# Patient Record
Sex: Female | Born: 1955 | Hispanic: No | Marital: Single | Smoking: Former smoker
Health system: Southern US, Community
[De-identification: ages and names within clinical notes are randomized; demographics above are authoritative.]

## PROBLEM LIST (undated history)

## (undated) DIAGNOSIS — G4700 Insomnia, unspecified: Secondary | ICD-10-CM

## (undated) DIAGNOSIS — F32A Depression, unspecified: Secondary | ICD-10-CM

## (undated) DIAGNOSIS — R0789 Other chest pain: Secondary | ICD-10-CM

## (undated) DIAGNOSIS — F329 Major depressive disorder, single episode, unspecified: Secondary | ICD-10-CM

## (undated) DIAGNOSIS — E785 Hyperlipidemia, unspecified: Secondary | ICD-10-CM

## (undated) HISTORY — DX: Other chest pain: R07.89

## (undated) HISTORY — DX: Depression, unspecified: F32.A

## (undated) HISTORY — DX: Hyperlipidemia, unspecified: E78.5

## (undated) HISTORY — DX: Insomnia, unspecified: G47.00

## (undated) HISTORY — DX: Major depressive disorder, single episode, unspecified: F32.9

## (undated) HISTORY — PX: HERNIA REPAIR: SHX51

---
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
## 2001-01-17 ENCOUNTER — Other Ambulatory Visit: Admission: RE | Admit: 2001-01-17 | Discharge: 2001-01-17 | Payer: Self-pay | Admitting: Obstetrics & Gynecology

## 2002-02-02 ENCOUNTER — Other Ambulatory Visit: Admission: RE | Admit: 2002-02-02 | Discharge: 2002-02-02 | Payer: Self-pay | Admitting: Obstetrics & Gynecology

## 2003-03-21 ENCOUNTER — Other Ambulatory Visit: Admission: RE | Admit: 2003-03-21 | Discharge: 2003-03-21 | Payer: Self-pay | Admitting: Obstetrics and Gynecology

## 2004-04-03 ENCOUNTER — Other Ambulatory Visit: Admission: RE | Admit: 2004-04-03 | Discharge: 2004-04-03 | Payer: Self-pay | Admitting: Obstetrics and Gynecology

## 2005-06-08 ENCOUNTER — Other Ambulatory Visit: Admission: RE | Admit: 2005-06-08 | Discharge: 2005-06-08 | Payer: Self-pay | Admitting: Obstetrics and Gynecology

## 2007-01-06 ENCOUNTER — Encounter: Admission: RE | Admit: 2007-01-06 | Discharge: 2007-01-06 | Payer: Self-pay | Admitting: Family Medicine

## 2008-12-30 ENCOUNTER — Encounter: Admission: RE | Admit: 2008-12-30 | Discharge: 2008-12-30 | Payer: Self-pay | Admitting: Family Medicine

## 2011-01-29 ENCOUNTER — Other Ambulatory Visit (HOSPITAL_COMMUNITY): Payer: Self-pay | Admitting: Obstetrics and Gynecology

## 2011-01-29 DIAGNOSIS — R109 Unspecified abdominal pain: Secondary | ICD-10-CM

## 2011-02-03 ENCOUNTER — Ambulatory Visit (HOSPITAL_COMMUNITY): Admission: RE | Admit: 2011-02-03 | Payer: 59 | Source: Ambulatory Visit

## 2011-09-10 ENCOUNTER — Ambulatory Visit
Admission: RE | Admit: 2011-09-10 | Discharge: 2011-09-10 | Disposition: A | Discharge status: Home / Self Care | Payer: 59 | Source: Ambulatory Visit | Attending: Family Medicine | Admitting: Family Medicine

## 2011-09-10 ENCOUNTER — Other Ambulatory Visit: Payer: Self-pay | Admitting: Family Medicine

## 2015-03-10 ENCOUNTER — Encounter: Payer: Self-pay | Admitting: *Deleted

## 2015-03-12 ENCOUNTER — Other Ambulatory Visit: Payer: Self-pay | Admitting: Family Medicine

## 2015-03-12 ENCOUNTER — Ambulatory Visit
Admission: RE | Admit: 2015-03-12 | Discharge: 2015-03-12 | Disposition: A | Discharge status: Home / Self Care | Payer: 59 | Source: Ambulatory Visit | Attending: Family Medicine | Admitting: Family Medicine

## 2015-03-12 DIAGNOSIS — R0789 Other chest pain: Secondary | ICD-10-CM

## 2015-03-13 ENCOUNTER — Other Ambulatory Visit: Payer: Self-pay | Admitting: Family Medicine

## 2015-03-13 DIAGNOSIS — R9389 Abnormal findings on diagnostic imaging of other specified body structures: Secondary | ICD-10-CM

## 2015-03-14 ENCOUNTER — Ambulatory Visit
Admission: RE | Admit: 2015-03-14 | Discharge: 2015-03-14 | Disposition: A | Discharge status: Home / Self Care | Payer: 59 | Source: Ambulatory Visit | Attending: Family Medicine | Admitting: Family Medicine

## 2015-03-14 DIAGNOSIS — R9389 Abnormal findings on diagnostic imaging of other specified body structures: Secondary | ICD-10-CM

## 2015-03-14 MED ORDER — IOPAMIDOL (ISOVUE-370) INJECTION 76%
120.0000 mL | Freq: Once | INTRAVENOUS | Status: AC | PRN
  Administered 2015-03-14: 120 mL via INTRAVENOUS

## 2015-03-26 ENCOUNTER — Other Ambulatory Visit: Payer: Self-pay | Admitting: Family Medicine

## 2015-03-26 DIAGNOSIS — K769 Liver disease, unspecified: Secondary | ICD-10-CM

## 2015-04-12 ENCOUNTER — Ambulatory Visit
Admission: RE | Admit: 2015-04-12 | Discharge: 2015-04-12 | Disposition: A | Discharge status: Home / Self Care | Payer: 59 | Source: Ambulatory Visit | Attending: Family Medicine | Admitting: Family Medicine

## 2015-04-12 DIAGNOSIS — K769 Liver disease, unspecified: Secondary | ICD-10-CM

## 2015-04-12 MED ORDER — GADOBENATE DIMEGLUMINE 529 MG/ML IV SOLN
12.0000 mL | Freq: Once | INTRAVENOUS | Status: AC | PRN
  Administered 2015-04-12: 12 mL via INTRAVENOUS

## 2015-04-28 ENCOUNTER — Encounter: Payer: Self-pay | Admitting: Cardiovascular Disease

## 2015-04-28 ENCOUNTER — Ambulatory Visit (INDEPENDENT_AMBULATORY_CARE_PROVIDER_SITE_OTHER): Payer: 59 | Admitting: Cardiovascular Disease

## 2015-04-28 VITALS — BP 100/60 | HR 66 | Ht 66.0 in | Wt 133.4 lb

## 2015-04-28 DIAGNOSIS — R079 Chest pain, unspecified: Secondary | ICD-10-CM

## 2015-04-28 DIAGNOSIS — R0789 Other chest pain: Secondary | ICD-10-CM | POA: Insufficient documentation

## 2015-04-28 MED ORDER — PANTOPRAZOLE SODIUM 40 MG PO TBEC: 40.0000 mg | DELAYED_RELEASE_TABLET | Freq: Every day | ORAL | Status: DC

## 2015-04-28 NOTE — Progress Notes (Signed)
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 She developed epigastric/low substernal pain +2 months ago that initially occurred several times a week and now is occurring on a daily basis lasting for hours at a time. There is no relationship with food, or position. There are no associated symptoms other than mild increasing shortness of breath. The symptoms do get worse when she gardens. She has a low pretest probability given her lack of symptoms although I am concerned that there is an exertional component as well as radiation to her back.  I'm going to empirically begin her on a proton pump inhibitor and obtain an exercise Myoview stress test. I will see her back after that for further evaluation.   Current Outpatient Prescriptions  Medication Sig Dispense Refill  . citalopram (CELEXA) 20 MG tablet Take 20 mg by mouth daily.    . drospirenone-estradiol (ANGELIQ) 0.5-1 MG per tablet Take 1 tablet by mouth daily.    . Multiple Vitamins-Minerals (MULTIVITAMIN WITH MINERALS) tablet Take 1 tablet by mouth daily.    . Omega-3 Fatty Acids (FISH OIL) 1000 MG CAPS Take 1,000 mg by mouth daily.    . vitamin C (ASCORBIC ACID) 500 MG tablet Take 500 mg by mouth daily.    [name] zolpidem (AMBIEN CR) 12.5 MG CR tablet Take 12.5 mg by mouth at bedtime as needed for sleep.    . pantoprazole (PROTONIX) 40 MG tablet Take 1 tablet (40 mg total) by mouth daily. 30 tablet 11   No current facility-administered medications for this visit.    Allergies  Allergen Reactions  . Penicillins Hives    History   Social History  . Marital Status: Single    Spouse Name: N/A  . Number of  Children: 1  . Years of Education: N/A   Occupational History  . Not on file.   Social History Main Topics  . Smoking status: Former Games developer  . Smokeless tobacco: Not on file     Comment: quit 1978  . Alcohol Use: 4.2 oz/week    7 Glasses of wine per week  . Drug Use: Not on file  . Sexual Activity: Not on file   Other Topics Concern  . Not on file   Social History Narrative     Review of Systems: General: negative for chills, fever, night sweats or weight changes.  Cardiovascular: negative for chest pain, dyspnea on exertion, edema, orthopnea, palpitations, paroxysmal nocturnal dyspnea or shortness of breath Dermatological: negative for rash Respiratory: negative for cough or wheezing Urologic: negative for hematuria Abdominal: negative for nausea, vomiting, diarrhea, bright red blood per rectum, melena, or hematemesis Neurologic: negative for visual changes, syncope, or dizziness All other systems reviewed and are otherwise negative except as noted above.    Blood pressure 100/60, pulse 66, height  (1.676 m), weight 133 lb 6.4 oz (60.51 kg).  General appearance: alert and no distress Neck: no adenopathy, no carotid bruit, no JVD, supple, symmetrical, trachea midline and thyroid not enlarged, symmetric, no tenderness/mass/nodules Lungs: clear to auscultation bilaterally Heart: regular rate and rhythm, S1, S2 normal, no murmur, click, rub or gallop Extremities: extremities normal, atraumatic, no cyanosis or edema  EKG total  sinus rhythm at 66 with a RV conduction delay. 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 She developed epigastric/low substernal pain +2 months ago that initially occurred several times a week and now is occurring on a  daily basis lasting for hours at a time. There is no relationship with food, or position. There are no associated symptoms other than mild increasing shortness of breath. The symptoms do get worse when she gardens. She has a low pretest probability given her lack of symptoms although I am concerned that there is an exertional component as well as radiation to her back.  I'm going to empirically begin her on a proton pump inhibitor and obtain an exercise Myoview stress test. I will see her back after that for further evaluation.      Runell Gess MD FACP,FACC,FAHA, [facility] 04/28/2015 3:14 PM

## 2015-04-28 NOTE — Patient Instructions (Signed)
Medication Instructions:  START Pantoprazole 40 mg - take 1 tablet daily. A prescription has been sent into your pharmacy electronically.  Testing/Procedures: Your physician has requested that you have en exercise stress myoview. For further information please visit [email or web address]. Please follow instruction sheet, as given.  Follow-Up: Dr Allyson SabalBerry recommends that you schedule a follow-up appointment after you have completed your stress test.

## 2015-04-28 NOTE — Assessment & Plan Note (Signed)
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 She developed epigastric/low substernal pain +2 months ago that initially occurred several times a week and now is occurring on a daily basis lasting for hours at a time. There is no relationship with food, or position. There are no associated symptoms other than mild increasing shortness of breath. The symptoms do get worse when she gardens. She has a low pretest probability given her lack of symptoms although I am concerned that there is an exertional component as well as radiation to her back.  I'm going to empirically begin her on a proton pump inhibitor and obtain an exercise Myoview stress test. I will see her back after that for further evaluation.

## 2015-04-29 ENCOUNTER — Telehealth (HOSPITAL_COMMUNITY): Payer: Self-pay

## 2015-04-29 NOTE — Telephone Encounter (Signed)
Encounter complete. 

## 2015-04-30 ENCOUNTER — Ambulatory Visit (HOSPITAL_COMMUNITY)
Admission: RE | Admit: 2015-04-30 | Discharge: 2015-04-30 | Disposition: A | Discharge status: Home / Self Care | Payer: 59 | Source: Ambulatory Visit | Attending: Internal Medicine | Admitting: Internal Medicine

## 2015-04-30 DIAGNOSIS — R079 Chest pain, unspecified: Secondary | ICD-10-CM

## 2015-04-30 LAB — MYOCARDIAL PERFUSION IMAGING
CHL CUP MPHR: 163 {beats}/min
CHL CUP NUCLEAR SRS: 1
CHL CUP RESTING HR STRESS: 70 {beats}/min
CSEPHR: 106 %
Estimated workload: 10.1 METS
Exercise duration (min): 9 min
Exercise duration (sec): 0 s
LV dias vol: 74 mL
LVSYSVOL: 30 mL
NUC STRESS TID: 1.12
Peak HR: 173 {beats}/min
RPE: 15
SDS: 0
SSS: 1

## 2015-04-30 MED ORDER — TECHNETIUM TC 99M SESTAMIBI GENERIC - CARDIOLITE
10.7000 | Freq: Once | INTRAVENOUS | Status: AC | PRN
  Administered 2015-04-30: 11 via INTRAVENOUS

## 2015-04-30 MED ORDER — TECHNETIUM TC 99M SESTAMIBI GENERIC - CARDIOLITE
31.5000 | Freq: Once | INTRAVENOUS | Status: AC | PRN
  Administered 2015-04-30: 32 via INTRAVENOUS

## 2015-05-02 ENCOUNTER — Ambulatory Visit: Payer: 59 | Admitting: Cardiovascular Disease

## 2016-04-13 ENCOUNTER — Ambulatory Visit (INDEPENDENT_AMBULATORY_CARE_PROVIDER_SITE_OTHER): Payer: 59 | Admitting: Emergency Medicine

## 2016-04-13 ENCOUNTER — Ambulatory Visit (INDEPENDENT_AMBULATORY_CARE_PROVIDER_SITE_OTHER): Payer: 59

## 2016-04-13 VITALS — BP 122/72 | HR 69 | Temp 98.2°F | Resp 17 | Ht 65.5 in | Wt 130.0 lb

## 2016-04-13 DIAGNOSIS — R0789 Other chest pain: Secondary | ICD-10-CM

## 2016-04-13 DIAGNOSIS — M542 Cervicalgia: Secondary | ICD-10-CM

## 2016-04-13 DIAGNOSIS — M5010 Cervical disc disorder with radiculopathy, unspecified cervical region: Secondary | ICD-10-CM | POA: Diagnosis not present

## 2016-04-13 MED ORDER — GABAPENTIN 100 MG PO CAPS: ORAL_CAPSULE | ORAL | Status: DC

## 2016-04-13 MED ORDER — MELOXICAM 15 MG PO TABS: 15.0000 mg | ORAL_TABLET | Freq: Every day | ORAL | Status: DC

## 2016-04-13 MED ORDER — CYCLOBENZAPRINE HCL 5 MG PO TABS: ORAL_TABLET | ORAL | Status: DC

## 2016-04-13 NOTE — Progress Notes (Addendum)
By signing my name below, I, Stann Oresung-Kai Tsai, attest that this documentation has been prepared under the direction and in the presence of Lesle ChrisSteven Daub, MD. Electronically Signed: Stann Oresung-Kai Tsai, Scribe. 04/13/2016 , 11:23 AM .  Patient was seen in room 13 .  Chief Complaint:  Chief Complaint  Patient presents with  . Shoulder Pain    right     HPI: Laurie Foster is a 60 y.o. female [facility] complaining of right shoulder pain that was noticed 2 days ago. Patient states pain radiating to her right upper chest, right side of her neck, and down her back. She reports the pain starting as a tightness, and then gradually progressed into a more sharp and throbbing pain. She mentions taking a 5 hour bus trip, and had friends sitting behind her. She mostly had her head turned to her right trying to talk to them throughout the bus trip. She also notes some pain down her right arm. She's taken 3 tablets of ibuprofen every 3 hours.   Past Medical History  Diagnosis Date  . Insomnia   . HLD (hyperlipidemia)   . Depressive disorder   . Atypical chest pain    Past Surgical History  Procedure Laterality Date  . Tonsillectomy and adenoidectomy  1963   Social History   Social History  . Marital Status: Single    Spouse Name: N/A  . Number of Children: 1  . Years of Education: N/A   Social History Main Topics  . Smoking status: Former Games developermoker  . Smokeless tobacco: None     Comment: quit 1978  . Alcohol Use: 4.2 oz/week    7 Glasses of wine per week  . Drug Use: None  . Sexual Activity: Not Asked   Other Topics Concern  . None   Social History Narrative   Family History  Problem Relation Age of Onset  . Cancer Father     gall bladder cancer  . Sudden death Mother     in sleep  . Hyperlipidemia Brother   . Hypertension Brother   . Healthy Daughter    Allergies  Allergen Reactions  . Penicillins Hives   Prior to Admission medications   Medication Sig Start  Date End Date Taking? Authorizing Provider  citalopram (CELEXA) 20 MG tablet Take 20 mg by mouth daily.   Yes Historical Provider, MD  drospirenone-estradiol (ANGELIQ) 0.5-1 MG per tablet Take 1 tablet by mouth daily.   Yes Historical Provider, MD  Multiple Vitamins-Minerals (MULTIVITAMIN WITH MINERALS) tablet Take 1 tablet by mouth daily.   Yes Historical Provider, MD  Omega-3 Fatty Acids (FISH OIL) 1000 MG CAPS Take 1,000 mg by mouth daily.   Yes Historical Provider, MD  vitamin C (ASCORBIC ACID) 500 MG tablet Take 500 mg by mouth daily.   Yes Historical Provider, MD  zolpidem (AMBIEN CR) 12.5 MG CR tablet Take 12.5 mg by mouth at bedtime as needed for sleep.   Yes Historical Provider, MD     ROS:  Constitutional: negative for fever, chills, night sweats, weight changes, or fatigue  HEENT: negative for vision changes, hearing loss, congestion, rhinorrhea, ST, epistaxis, or sinus pressure Cardiovascular: negative for chest pain or palpitations Respiratory: negative for hemoptysis, wheezing, shortness of breath, or cough Abdominal: negative for abdominal pain, nausea, vomiting, diarrhea, or constipation Dermatological: negative for rash Musc: positive for arthralgia (right shoulder), back pain, myalgia Neurologic: negative for headache, dizziness, or syncope All other systems reviewed and are otherwise  negative with the exception to those above and in the HPI.  PHYSICAL EXAM: Filed Vitals:   04/13/16 1107  BP: 122/72  Pulse: 69  Temp: 98.2 F (36.8 C)  Resp: 17   Body mass index is 21.3 kg/(m^2).   General: Alert, no acute distress HEENT:  Normocephalic, atraumatic, oropharynx patent. Eye: Nonie Hoyer [facility] Cardiovascular:  Regular rate and rhythm, no rubs murmurs or gallops.  No Carotid bruits, radial pulse intact. No pedal edema.  Respiratory: Clear to auscultation bilaterally.  No wheezes, rales, or rhonchi.  No cyanosis, no use of accessory musculature Abdominal: No  organomegaly, abdomen is soft and non-tender, positive bowel sounds. No masses. Musculoskeletal: Gait intact. Tender over right posterior cervical area, and supraclavicular area, pain with turning to the left Skin: No rashes. Neurologic: Facial musculature symmetric; deep tendon reflexes 2+, motor strength 5/5 Psychiatric: Patient acts appropriately throughout our interaction.  Lymphatic: No cervical or submandibular lymphadenopathy Genitourinary/Anorectal: No acute findings  LABS:   EKG/XRAY:   Dg Chest 2 View  04/13/2016  CLINICAL DATA:  Chest pain and right shoulder pain EXAM: CHEST  2 VIEW COMPARISON:  None. FINDINGS: The heart size and mediastinal contours are within normal limits. Both lungs are clear. The visualized skeletal structures are unremarkable. IMPRESSION: No active cardiopulmonary disease. Electronically Signed   By: Marlan Palau M.D.   On: 04/13/2016 11:58   Dg Cervical Spine Complete  04/13/2016  CLINICAL DATA:  Right neck pain EXAM: CERVICAL SPINE - COMPLETE 4+ VIEW COMPARISON:  None. FINDINGS: Mild anterior slip C4-5 with disc and facet degeneration. Moderate disc degeneration and spurring at C5-6 with disc space narrowing and osteophyte formation. Mild foraminal narrowing bilaterally at C3-4. Moderate foraminal narrowing bilaterally at C5-6. Negative for fracture. 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 Electronically Signed   By: Marlan Palau M.D.   On: 04/13/2016 11:59     ASSESSMENT/PLAN: We'll treat with Mobic 1 a day and  gabapentin 100 mg 1-2  3 times a day and Flexeril at night. Recheck on either Saturday.or Friday.I personally performed the services described in this documentation, which was scribed in my presence. The recorded information has been reviewed and is accurate. She was advised she cannot take Ambien and Flexeril at night. We'll recheck Friday Saturday if continued symptoms may need referral or possibly MRI.I personally performed  the services described in this documentation, which was scribed in my presence. The recorded information has been reviewed and is accurate.  Gross sideeffects, risk and benefits, and alternatives of medications d/w patient. Patient is aware that all medications have potential sideeffects and we are unable to predict every sideeffect or drug-drug interaction that may occur.  Lesle Chris MD 04/13/2016 11:23 AM

## 2016-04-13 NOTE — Patient Instructions (Addendum)
IF you received an x-ray today, you will receive an invoice from [facility]. Please contact [facility] at [phone] with questions or concerns regarding your invoice.   IF you received labwork today, you will receive an invoice from [facility]. Please contact [facility] at [phone] with questions or concerns regarding your invoice.   Our billing staff will not be able to assist you with questions regarding bills from these companies.  You will be contacted with the lab results as soon as they are available. The fastest way to get your results is to activate your My Chart account. Instructions are located on the last page of this paperwork. If you have not heard from us regarding the results in 2 weeks, please contact this office.     We recommend that you schedule a mammogram for breast cancer screening. Typically, you do not need a referral to do this. Please contact a local imaging center to schedule your mammogram.  [facility] - [phone]  *ask for the Radiology Department [facility]) - [phone] or [phone]  [facility] - [phone] [facility] - [phone] [facility] - [phone]  *ask for the Radiology Department [facility] - [phone]) [phone]  *ask for the Radiology Department [facility] - [phone]  *ask for the Mammography Department [facility] - [phone] Cervical Radiculopathy Cervical radiculopathy happens when a nerve in the neck (cervical nerve) is pinched or bruised. This condition can develop because of an injury or as part of the normal aging process. Pressure on the cervical nerves can cause pain or numbness that runs from the neck all the way down into the arm and fingers. Usually, this condition gets better with rest. Treatment may be needed if the condition does not improve.   CAUSES This condition may be caused by:  Injury.  Slipped (herniated) disk.  Muscle tightness in the neck because of overuse.  Arthritis.  Breakdown or degeneration in the bones and joints of the spine (spondylosis) due to aging.  Bone spurs that may develop near the cervical nerves. SYMPTOMS Symptoms of this condition include:  Pain that runs from the neck to the arm and hand. The pain can be severe or irritating. It may be worse when the neck is moved.  Numbness or weakness in the affected arm and hand. DIAGNOSIS This condition may be diagnosed based on symptoms, medical history, and a physical exam. You may also have tests, including:  X-rays.  CT scan.  MRI.  Electromyogram (EMG).  Nerve conduction tests. TREATMENT In many cases, treatment is not needed for this condition. With rest, the condition usually gets better over time. If treatment is needed, options may include:  Wearing a soft neck collar for short periods of time.  Physical therapy to strengthen your neck muscles.  Medicines, such as NSAIDs, oral corticosteroids, or spinal injections.  Surgery. This may be needed if other treatments do not help. Various types of surgery may be done depending on the cause of your problems. HOME CARE INSTRUCTIONS Managing Pain  Take over-the-counter and prescription medicines only as told by your health care provider.  If directed, apply ice to the affected area.  Put ice in a plastic bag.  Place a towel between your skin and the bag.  Leave the ice on for 20 minutes, 2-3 times per day.  If ice does not help, you can try using  heat. Take a warm shower or warm bath, or use a heat pack as told by your health care provider.  Try a gentle neck and shoulder massage to help relieve symptoms. Activity  Rest as needed. Follow instructions from your health care provider about any restrictions on activities.  Do stretching and strengthening exercises as told by your  health care provider or physical therapist. General Instructions  If you were given a soft collar, wear it as told by your health care provider.  Use a flat pillow when you sleep.  Keep all follow-up visits as told by your health care provider. This is important. SEEK MEDICAL CARE IF:  Your condition does not improve with treatment. SEEK IMMEDIATE MEDICAL CARE IF:  Your pain gets much worse and cannot be controlled with medicines.  You have weakness or numbness in your hand, arm, face, or leg.  You have a high fever.  You have a stiff, rigid neck.  You lose control of your bowels or your bladder (have incontinence).  You have trouble with walking, balance, or speaking.   This information is not intended to replace advice given to you by your health care provider. Make sure you discuss any questions you have with your health care provider.   Document Released: 07/13/2001 Document Revised: 07/09/2015 Document Reviewed: 12/12/2014 Elsevier Interactive Patient Education [employer]

## 2016-04-14 ENCOUNTER — Telehealth: Payer: Self-pay

## 2016-04-14 NOTE — Telephone Encounter (Signed)
Pt needs copies of her neck and shoulder xrays  Best number [phone]

## 2016-04-15 NOTE — Telephone Encounter (Signed)
At the patient's request I made a copy of her x-ray and I put it in the pick up drawer. I also notified the patient.

## 2016-04-19 ENCOUNTER — Other Ambulatory Visit: Payer: Self-pay | Admitting: Cardiovascular Disease

## 2016-04-23 ENCOUNTER — Other Ambulatory Visit: Payer: Self-pay | Admitting: Family Medicine

## 2016-04-23 DIAGNOSIS — M5412 Radiculopathy, cervical region: Secondary | ICD-10-CM

## 2016-05-15 ENCOUNTER — Ambulatory Visit
Admission: RE | Admit: 2016-05-15 | Discharge: 2016-05-15 | Disposition: A | Discharge status: Home / Self Care | Payer: 59 | Source: Ambulatory Visit | Attending: Family Medicine | Admitting: Family Medicine

## 2016-05-15 DIAGNOSIS — M5412 Radiculopathy, cervical region: Secondary | ICD-10-CM

## 2017-09-27 DIAGNOSIS — M180 Bilateral primary osteoarthritis of first carpometacarpal joints: Secondary | ICD-10-CM | POA: Insufficient documentation

## 2018-01-22 ENCOUNTER — Other Ambulatory Visit: Payer: Self-pay

## 2018-01-22 ENCOUNTER — Emergency Department (HOSPITAL_BASED_OUTPATIENT_CLINIC_OR_DEPARTMENT_OTHER)
Admission: EM | Admit: 2018-01-22 | Discharge: 2018-01-22 | Disposition: A | Discharge status: Home / Self Care | Payer: 59 | Attending: Emergency Medicine | Admitting: Emergency Medicine

## 2018-01-22 ENCOUNTER — Encounter (HOSPITAL_BASED_OUTPATIENT_CLINIC_OR_DEPARTMENT_OTHER): Payer: Self-pay | Admitting: Emergency Medicine

## 2018-01-22 DIAGNOSIS — R197 Diarrhea, unspecified: Secondary | ICD-10-CM | POA: Diagnosis not present

## 2018-01-22 DIAGNOSIS — Z87891 Personal history of nicotine dependence: Secondary | ICD-10-CM | POA: Insufficient documentation

## 2018-01-22 DIAGNOSIS — R112 Nausea with vomiting, unspecified: Secondary | ICD-10-CM

## 2018-01-22 DIAGNOSIS — Z79899 Other long term (current) drug therapy: Secondary | ICD-10-CM | POA: Diagnosis not present

## 2018-01-22 LAB — CBC
HCT: 39.1 % (ref 36.0–46.0)
HEMOGLOBIN: 13.3 g/dL (ref 12.0–15.0)
MCH: 29.8 pg (ref 26.0–34.0)
MCHC: 34 g/dL (ref 30.0–36.0)
MCV: 87.5 fL (ref 78.0–100.0)
Platelets: 195 10*3/uL (ref 150–400)
RBC: 4.47 MIL/uL (ref 3.87–5.11)
RDW: 16.5 % — ABNORMAL HIGH (ref 11.5–15.5)
WBC: 8.3 10*3/uL (ref 4.0–10.5)

## 2018-01-22 LAB — COMPREHENSIVE METABOLIC PANEL
ALT: 13 U/L — AB (ref 14–54)
ANION GAP: 10 (ref 5–15)
AST: 20 U/L (ref 15–41)
Albumin: 4.6 g/dL (ref 3.5–5.0)
Alkaline Phosphatase: 49 U/L (ref 38–126)
BUN: 17 mg/dL (ref 6–20)
CALCIUM: 9.3 mg/dL (ref 8.9–10.3)
CHLORIDE: 103 mmol/L (ref 101–111)
CO2: 24 mmol/L (ref 22–32)
CREATININE: 0.63 mg/dL (ref 0.44–1.00)
Glucose, Bld: 141 mg/dL — ABNORMAL HIGH (ref 65–99)
Potassium: 3.9 mmol/L (ref 3.5–5.1)
SODIUM: 137 mmol/L (ref 135–145)
Total Bilirubin: 0.6 mg/dL (ref 0.3–1.2)
Total Protein: 7.4 g/dL (ref 6.5–8.1)

## 2018-01-22 LAB — URINALYSIS, ROUTINE W REFLEX MICROSCOPIC
Bilirubin Urine: NEGATIVE
Glucose, UA: NEGATIVE mg/dL
Hgb urine dipstick: NEGATIVE
Ketones, ur: 15 mg/dL — AB
LEUKOCYTES UA: NEGATIVE
Nitrite: NEGATIVE
Protein, ur: NEGATIVE mg/dL
SPECIFIC GRAVITY, URINE: 1.02 (ref 1.005–1.030)
pH: 6 (ref 5.0–8.0)

## 2018-01-22 LAB — LIPASE, BLOOD: LIPASE: 25 U/L (ref 11–51)

## 2018-01-22 MED ORDER — SODIUM CHLORIDE 0.9 % IV BOLUS (SEPSIS)
1000.0000 mL | Freq: Once | INTRAVENOUS | Status: AC
  Administered 2018-01-22: 1000 mL via INTRAVENOUS

## 2018-01-22 MED ORDER — ONDANSETRON 4 MG PO TBDP: 4.0000 mg | ORAL_TABLET | Freq: Three times a day (TID) | ORAL | 0 refills | Status: DC | PRN

## 2018-01-22 MED ORDER — ONDANSETRON HCL 4 MG/2ML IJ SOLN
4.0000 mg | Freq: Once | INTRAMUSCULAR | Status: AC | PRN
  Administered 2018-01-22: 4 mg via INTRAVENOUS
  Filled 2018-01-22: qty 2

## 2018-01-22 NOTE — ED Notes (Signed)
Ginger ale given

## 2018-01-22 NOTE — ED Triage Notes (Signed)
N/V/D since early this morning. Denies pain.

## 2018-01-22 NOTE — Discharge Instructions (Addendum)
Symptoms seem consistent likely a viral testicle infection.  I do not feel that you need antibiotics at this time.  Have given you Zofran for any nausea or vomiting.  Clear liquid diet for 24 hours and advance her diet as tolerated.  You may take Imodium if your diarrhea is persistent.  Drink plenty of fluids.  If you develop any bloody stools, fevers, or abdominal pain return to the ED.  Follow with primary care doctor return to ED with worsening symptoms.

## 2018-01-22 NOTE — ED Notes (Signed)
PT able to ambulate entire ED without feeling dizzy or lightheaded.

## 2018-01-22 NOTE — ED Notes (Signed)
Tolerated PO challenge without futher vomiting.

## 2018-01-22 NOTE — ED Provider Notes (Signed)
[facility] EMERGENCY DEPARTMENT Provider Note   CSN: [id number] Arrival date & time: 01/22/18  1512     History   Chief Complaint Chief Complaint  Patient presents with  . Emesis  . Diarrhea    HPI Laurie Foster is a 62 y.o. female.  HPI 62 year old female with past medical history significant for hyperlipidemia presents to the emergency department today for evaluation of nausea, vomiting and diarrhea.  Acute onset at 6 AM this morning.  Patient states she has had several episodes of loose stool without any melena or hematochezia.  Also reports several episodes of nonbloody bilious emesis.  Patient has taken Imodium for her diarrhea with improvement.  Patient denies any associated pain.  She reports passing gas.  Patient states that she did do a cooking class last night with some foreign food that may have caused her symptoms.  She denies any known sick contacts.  Patient denies any associated lightheadedness or dizziness.  Nothing makes her symptoms better or worse.  Denies any recent travel outside the [place].  Denies any abdominal surgeries.  Pt denies any fever, chill, ha, vision changes, lightheadedness, dizziness, congestion, neck pain, cp, sob, cough, urinary symptoms, change in bowel habits, melena, hematochezia, lower extremity paresthesias.  Past Medical History:  Diagnosis Date  . Atypical chest pain   . Depressive disorder   . HLD (hyperlipidemia)   . Insomnia     Patient Active Problem List   Diagnosis Date Noted  . Atypical chest pain 04/28/2015    Past Surgical History:  Procedure Laterality Date  . TONSILLECTOMY AND ADENOIDECTOMY  1963     OB History   None      Home Medications    Prior to Admission medications   Medication Sig Start Date End Date Taking? Authorizing Provider  citalopram (CELEXA) 20 MG tablet Take 20 mg by mouth daily.    [provider]  cyclobenzaprine (FLEXERIL) 5 MG tablet 1 by mouth daily at bedtime 04/13/16    Collene Gobble, MD  drospirenone-estradiol (ANGELIQ) 0.5-1 MG per tablet Take 1 tablet by mouth daily.    [provider]  gabapentin (NEURONTIN) 100 MG capsule Take 1-2 capsules 3 times a day 04/13/16   Collene Gobble, MD  meloxicam (MOBIC) 15 MG tablet Take 1 tablet (15 mg total) by mouth daily. 04/13/16   Collene Gobble, MD  Multiple Vitamins-Minerals (MULTIVITAMIN WITH MINERALS) tablet Take 1 tablet by mouth daily.    [provider]  Omega-3 Fatty Acids (FISH OIL) 1000 MG CAPS Take 1,000 mg by mouth daily.    [provider]  ondansetron (ZOFRAN ODT) 4 MG disintegrating tablet Take 1 tablet (4 mg total) by mouth every 8 (eight) hours as needed for nausea or vomiting. 01/22/18   Haruye Lainez, Lynann Beaver, PA-C  pantoprazole (PROTONIX) 40 MG tablet Take 1 tablet (40 mg total) by mouth daily. Please make appointment. 04/19/16   Runell Gess, MD  vitamin C (ASCORBIC ACID) 500 MG tablet Take 500 mg by mouth daily.    [provider]  zolpidem (AMBIEN CR) 12.5 MG CR tablet Take 12.5 mg by mouth at bedtime as needed for sleep.    [provider]    Family History Family History  Problem Relation Age of Onset  . Cancer Father        gall bladder cancer  . Sudden death Mother        in sleep  . Hyperlipidemia Brother   .  Hypertension Brother   . Healthy Daughter     Social History Social History   Tobacco Use  . Smoking status: Former Games developer  . Smokeless tobacco: Never Used  . Tobacco comment: quit 1978  Substance Use Topics  . Alcohol use: Yes    Alcohol/week: 4.2 oz    Types: 7 Glasses of wine per week  . Drug use: Not on file     Allergies   Penicillins   Review of Systems Review of Systems  All other systems reviewed and are negative.    Physical Exam Updated Vital Signs BP 107/60   Pulse 77   Temp 99.2 F (37.3 C) (Oral)   Resp 18   Ht 5\' 6"  (1.676 m)   Wt 59.9 kg (132 lb)   SpO2 95%   BMI 21.31 kg/m   Physical  Exam  Constitutional: She is oriented to person, place, and time. She appears well-developed and well-nourished.  Non-toxic appearance. No distress.  HENT:  Head: Normocephalic and atraumatic.  Nose: Nose normal.  Mouth/Throat: Oropharynx is clear and moist.  Eyes: Pupils are equal, round, and reactive to light. Conjunctivae are normal. Right eye exhibits no discharge. Left eye exhibits no discharge.  Neck: Normal range of motion. Neck supple.  Cardiovascular: Normal rate, regular rhythm, normal heart sounds and intact distal pulses. Exam reveals no gallop and no friction rub.  No murmur heard. Pulmonary/Chest: Effort normal and breath sounds normal. No stridor. No respiratory distress. She has no wheezes. She has no rales. She exhibits no tenderness.  Abdominal: Soft. Bowel sounds are increased. There is no tenderness. There is no rigidity, no rebound, no guarding, no CVA tenderness, no tenderness at McBurney's point and negative Murphy's sign.  Musculoskeletal: Normal range of motion. She exhibits no tenderness.  Lymphadenopathy:    She has no cervical adenopathy.  Neurological: She is alert and oriented to person, place, and time.  Skin: Skin is warm and dry. Capillary refill takes less than 2 seconds.  Psychiatric: Her behavior is normal. Judgment and thought content normal.  Nursing note and vitals reviewed.    ED Treatments / Results  Labs (all labs ordered are listed, but only abnormal results are displayed) Labs Reviewed  COMPREHENSIVE METABOLIC PANEL - Abnormal; Notable for the following components:      Result Value   Glucose, Bld 141 (*)    ALT 13 (*)    All other components within normal limits  CBC - Abnormal; Notable for the following components:   RDW 16.5 (*)    All other components within normal limits  URINALYSIS, ROUTINE W REFLEX MICROSCOPIC - Abnormal; Notable for the following components:   Ketones, ur 15 (*)    All other components within normal limits    LIPASE, BLOOD    EKG None  Radiology No results found.  Procedures Procedures (including critical care time)  Medications Ordered in ED Medications  ondansetron (ZOFRAN) injection 4 mg (4 mg Intravenous Given 01/22/18 1558)  sodium chloride 0.9 % bolus 1,000 mL (0 mLs Intravenous Stopped 01/22/18 1711)     Initial Impression / Assessment and Plan / ED Course  I have reviewed the triage vital signs and the nursing notes.  Pertinent labs & imaging results that were available during my care of the patient were reviewed by me and considered in my medical decision making (see chart for details).    To the ED with acute onset of nausea, vomiting and diarrhea onset this morning.  This occurred  after going to a cooking class last night.  Dated bloody stools, fevers or abdominal pain.  Patient is nontoxic, nonseptic appearing, in no apparent distress.  Patient's pain and other symptoms adequately managed in emergency department.  Fluid bolus given.  Labs, imaging and vitals reviewed.  No leukocytosis.  Normal liver enzymes.  Normal kidney function.  Lipase is normal.  UA shows no signs of infection.   Given 1.5 L of fluid.  Was given Zofran.  She states that she feels significantly improved.  Patient tolerating p.o. fluids without any emesis.  Patient does have some hypotension noted however she states this is her baseline blood pressure.  Patient denies any associated lightheadedness or dizziness.  Able to ambulate around the department without any symptoms.  Blood pressure has remained stable.  Doubt septic shock.   Patient does not meet the SIRS or Sepsis criteria.  On repeat exam patient does not have a surgical abdomin and there are no peritoneal signs.  No indication of appendicitis, bowel obstruction, bowel perforation, cholecystitis, diverticulitis.  Patient discharged home with symptomatic treatment and given strict instructions for follow-up with their primary care physician.    Pt  is hemodynamically stable, in NAD, & able to ambulate in the ED. Evaluation does not show pathology that would require ongoing emergent intervention or inpatient treatment. I explained the diagnosis to the patient. Pain has been managed & has no complaints prior to dc. Pt is comfortable with above plan and is stable for discharge at this time. All questions were answered prior to disposition. Strict return precautions for f/u to the ED were discussed. Encouraged follow up with PCP.  Case dicussed with Dr. Jacqulyn BathLong who is agreeable with the above plan.      Final Clinical Impressions(s) / ED Diagnoses   Final diagnoses:  Nausea vomiting and diarrhea    ED Discharge Orders        Ordered    ondansetron (ZOFRAN ODT) 4 MG disintegrating tablet  Every 8 hours PRN     01/22/18 1826       Wallace KellerLeaphart, Deigo Alonso T, PA-C 01/22/18 [place] Davenport1935    Long, Joshua G, MD 01/23/18 1135

## 2018-08-24 ENCOUNTER — Ambulatory Visit: Payer: Self-pay

## 2018-08-24 ENCOUNTER — Ambulatory Visit: Payer: 59 | Admitting: Family Medicine

## 2018-08-24 ENCOUNTER — Encounter: Payer: Self-pay | Admitting: Family Medicine

## 2018-08-24 ENCOUNTER — Ambulatory Visit (INDEPENDENT_AMBULATORY_CARE_PROVIDER_SITE_OTHER)
Admission: RE | Admit: 2018-08-24 | Discharge: 2018-08-24 | Disposition: A | Discharge status: Home / Self Care | Payer: 59 | Source: Ambulatory Visit | Attending: Family Medicine | Admitting: Family Medicine

## 2018-08-24 VITALS — BP 110/70 | HR 68 | Ht 66.0 in | Wt 139.0 lb

## 2018-08-24 DIAGNOSIS — G8929 Other chronic pain: Secondary | ICD-10-CM

## 2018-08-24 DIAGNOSIS — M25512 Pain in left shoulder: Secondary | ICD-10-CM

## 2018-08-24 MED ORDER — VITAMIN D (ERGOCALCIFEROL) 1.25 MG (50000 UNIT) PO CAPS: 50000.0000 [IU] | ORAL_CAPSULE | ORAL | 0 refills | Status: DC

## 2018-08-24 NOTE — Assessment & Plan Note (Signed)
Fall 3 months ago.  Worsening pain over the course of last several weeks.  X-rays ordered today.  Ultrasound today shows the potential for compression fracture.  Started once weekly vitamin D, icing regimen, home exercises to work with Event organiser.  Patient is to increase activity as tolerated.  Follow-up with me again in 4 weeks.

## 2018-08-24 NOTE — Progress Notes (Signed)
[facility] [address] [place], [place] [address] Phone: [phone] Subjective:    I'm seeing this patient by the request  of:  Shaune Pollack, MD   CC: Left shoulder pain  [id number]  Laurie Foster is a 62 y.o. female coming in with complaint of left shoulder pain. Fell on 05/03/2018. Has limited mobility. Did seek treatment on October 7th for a flare in that arm. Was given naproxen by Urgent Care. Pain is over superior shoulder radiating into her arm to the elbow. Denies any tingling. Keeping arm still relieves pain. Has tried HEP from the Urgent Care. Does note that her right shoulder is also now bothering her. No injury since the fall      Past Medical History:  Diagnosis Date  . Atypical chest pain   . Depressive disorder   . HLD (hyperlipidemia)   . Insomnia    Past Surgical History:  Procedure Laterality Date  . TONSILLECTOMY AND ADENOIDECTOMY  1963   Social History   Socioeconomic History  . Marital status: Single    Spouse name: Not on file  . Number of children: 1  . Years of education: Not on file  . Highest education level: Not on file  Occupational History  . Not on file  Social Needs  . Financial resource strain: Not on file  . Food insecurity:    Worry: Not on file    Inability: Not on file  . Transportation needs:    Medical: Not on file    Non-medical: Not on file  Tobacco Use  . Smoking status: Former Games developer  . Smokeless tobacco: Never Used  . Tobacco comment: quit 1978  Substance and Sexual Activity  . Alcohol use: Yes    Alcohol/week: 7.0 standard drinks    Types: 7 Glasses of wine per week  . Drug use: Not on file  . Sexual activity: Not on file  Lifestyle  . Physical activity:    Days per week: Not on file    Minutes per session: Not on file  . Stress: Not on file  Relationships  . Social connections:    Talks on phone: Not on file    Gets together: Not on file    Attends religious service: Not  on file    Active member of club or organization: Not on file    Attends meetings of clubs or organizations: Not on file    Relationship status: Not on file  Other Topics Concern  . Not on file  Social History Narrative  . Not on file   Allergies  Allergen Reactions  . Penicillins Hives   Family History  Problem Relation Age of Onset  . Cancer Father        gall bladder cancer  . Sudden death Mother        in sleep  . Hyperlipidemia Brother   . Hypertension Brother   . Healthy Daughter     Current Outpatient Medications (Endocrine & Metabolic):  .  drospirenone-estradiol (ANGELIQ) 0.5-1 MG per tablet, Take 1 tablet by mouth daily.    Current Outpatient Medications (Analgesics):  .  meloxicam (MOBIC) 15 MG tablet, Take 1 tablet (15 mg total) by mouth daily.   Current Outpatient Medications (Other):  .  citalopram (CELEXA) 20 MG tablet, Take 20 mg by mouth daily. .  cyclobenzaprine (FLEXERIL) 5 MG tablet, 1 by mouth daily at bedtime .  gabapentin (NEURONTIN) 100 MG capsule, Take 1-2 capsules 3 times  a day .  Multiple Vitamins-Minerals (MULTIVITAMIN WITH MINERALS) tablet, Take 1 tablet by mouth daily. .  Omega-3 Fatty Acids (FISH OIL) 1000 MG CAPS, Take 1,000 mg by mouth daily. .  ondansetron (ZOFRAN ODT) 4 MG disintegrating tablet, Take 1 tablet (4 mg total) by mouth every 8 (eight) hours as needed for nausea or vomiting. .  pantoprazole (PROTONIX) 40 MG tablet, Take 1 tablet (40 mg total) by mouth daily. Please make appointment. .  vitamin C (ASCORBIC ACID) 500 MG tablet, Take 500 mg by mouth daily. [name]  zolpidem (AMBIEN CR) 12.5 MG CR tablet, Take 12.5 mg by mouth at bedtime as needed for sleep. .  Vitamin D, Ergocalciferol, (DRISDOL) 50000 units CAPS capsule, Take 1 capsule (50,000 Units total) by mouth every 7 (seven) days.    Past medical history, social, surgical and family history all reviewed in electronic medical record.  No pertanent information unless stated  regarding to the chief complaint.   Review of Systems:  No headache, visual changes, nausea, vomiting, diarrhea, constipation, dizziness, abdominal pain, skin rash, fevers, chills, night sweats, weight loss, swollen lymph nodes, body aches, joint swelling, muscle aches, chest pain, shortness of breath, mood changes.   Objective  Blood pressure 110/70, pulse 68, height 5\' 6"  (1.676 m), weight 139 lb (63 kg), SpO2 99 %.    General: No apparent distress alert and oriented x3 mood and affect normal, dressed appropriately.  HEENT: Pupils equal, extraocular movements intact  Respiratory: Patient's speak in full sentences and does not appear short of breath  Cardiovascular: No lower extremity edema, non tender, no erythema  Skin: Warm dry intact with no signs of infection or rash on extremities or on axial skeleton.  Abdomen: Soft nontender  Neuro: Cranial nerves II through XII are intact, neurovascularly intact in all extremities with 2+ DTRs and 2+ pulses.  Lymph: No lymphadenopathy of posterior or anterior cervical chain or axillae bilaterally.  Gait normal with good balance and coordination.  MSK:  Non tender with full range of motion and good stability and symmetric strength and tone of  elbows, wrist, hip, knee and ankles bilaterally.  Shoulder: Left Inspection reveals no abnormalities, atrophy or asymmetry. Palpation is normal with no tenderness over AC joint or bicipital groove. ROM lacks last 5 degrees of external rotation Rotator cuff strength normal throughout. Mild positive impingement Speeds and Yergason's tests normal. Mild positive impingement and O'Brien Normal scapular function observed. No painful arc and no drop arm sign. No apprehension sign Contralateral shoulder unremarkable  MSK US performed of: Left shoulder This study was ordered, performed, and interpreted by Terrilee Files D.O.  Shoulder:   Supraspinatus: Mild tearing noted.  Hypoechoic changes. Subscapularis:  Mild hypoechoic changes  AC joint:  Capsule undistended, no geyser sign. Glenohumeral Joint:  Appears normal without effusion.  Posteriorly though patient does have a superior humeral with appears to be small compression fracture noted. Glenoid Labrum:  Intact without visualized tears. Biceps Tendon:  Appears normal on long and transverse views, no fraying of tendon, tendon located in intertubercular groove, no subluxation with shoulder internal or external rotation. No increased power doppler signal. Impression potential compression fracture of the humeral head small.     [id number]; 15 additional minutes spent for Therapeutic exercises as stated in above notes.  This included exercises focusing on stretching, strengthening, with significant focus on eccentric aspects.   Long term goals include an improvement in range of motion, strength, endurance as well as avoiding reinjury. Patient's frequency would include  in 1-2 times a day, 3-5 times a week for a duration of 6-12 weeks. Shoulder Exercises that included:  Basic scapular stabilization to include adduction and depression of scapula Scaption, focusing on proper movement and good control Internal and External rotation utilizing a theraband, with elbow tucked at side entire time Rows with theraband which was given .shoulder Proper technique shown and discussed handout in great detail with ATC.  All questions were discussed and answered.   Impression and Recommendations:     This case required medical decision making of moderate complexity. The above documentation has been reviewed and is accurate and complete Judi Saa, DO       Note: This dictation was prepared with Dragon dictation along with smaller phrase technology. Any transcriptional errors that result from this process are unintentional.

## 2018-08-24 NOTE — Patient Instructions (Addendum)
Good to see you  Small compression fracture  Ice is your friend Ice 20 minutes 2 times daily. Usually after activity and before bed. pennsaid pinkie amount topically 2 times daily as needed.  Once weekly vitamin D for 12 weeks Exercises 3 times a week.   See me again 4 weeks

## 2018-09-21 ENCOUNTER — Ambulatory Visit: Payer: 59 | Admitting: Family Medicine

## 2018-09-21 ENCOUNTER — Ambulatory Visit: Payer: Self-pay

## 2018-09-21 ENCOUNTER — Encounter: Payer: Self-pay | Admitting: Family Medicine

## 2018-09-21 VITALS — BP 102/60 | HR 72 | Ht 66.0 in | Wt 141.0 lb

## 2018-09-21 DIAGNOSIS — G8929 Other chronic pain: Secondary | ICD-10-CM

## 2018-09-21 DIAGNOSIS — M25512 Pain in left shoulder: Secondary | ICD-10-CM

## 2018-09-21 NOTE — Progress Notes (Signed)
Laurie Foster D.O. [facility] [address] [place], [place] [address] Phone: [phone]) [phone] Subjective:     CC: Left shoulder follow-up  ION:[id number]:Subjective  Laurie BlalockGuylaine Foster is a 62 y.o. female coming in with complaint of left shoulder pain. Has been doing exercises. Is using vitamin d. Still having pain with ab and adduction. Shoulder is tight with flexion.  Would state approximately 85% better.  Small concern for potential compression fracture.  Patient did have some mild flattening of the humeral head but no true fracture noted on x-rays that were independently visualized by me.  He did start on vitamin D and doing home exercises and is making improvement      Past Medical History:  Diagnosis Date  . Atypical chest pain   . Depressive disorder   . HLD (hyperlipidemia)   . Insomnia    Past Surgical History:  Procedure Laterality Date  . TONSILLECTOMY AND ADENOIDECTOMY  1963   Social History   Socioeconomic History  . Marital status: Single    Spouse name: Not on file  . Number of children: 1  . Years of education: Not on file  . Highest education level: Not on file  Occupational History  . Not on file  Social Needs  . Financial resource strain: Not on file  . Food insecurity:    Worry: Not on file    Inability: Not on file  . Transportation needs:    Medical: Not on file    Non-medical: Not on file  Tobacco Use  . Smoking status: Former Games developermoker  . Smokeless tobacco: Never Used  . Tobacco comment: quit 1978  Substance and Sexual Activity  . Alcohol use: Yes    Alcohol/week: 7.0 standard drinks    Types: 7 Glasses of wine per week  . Drug use: Not on file  . Sexual activity: Not on file  Lifestyle  . Physical activity:    Days per week: Not on file    Minutes per session: Not on file  . Stress: Not on file  Relationships  . Social connections:    Talks on phone: Not on file    Gets together: Not on file    Attends religious service: Not on file   Active member of club or organization: Not on file    Attends meetings of clubs or organizations: Not on file    Relationship status: Not on file  Other Topics Concern  . Not on file  Social History Narrative  . Not on file   Allergies  Allergen Reactions  . Penicillins Hives   Family History  Problem Relation Age of Onset  . Cancer Father        gall bladder cancer  . Sudden death Mother        in sleep  . Hyperlipidemia Brother   . Hypertension Brother   . Healthy Daughter     Current Outpatient Medications (Endocrine & Metabolic):  .  drospirenone-estradiol (ANGELIQ) 0.5-1 MG per tablet, Take 1 tablet by mouth daily.    Current Outpatient Medications (Analgesics):  .  meloxicam (MOBIC) 15 MG tablet, Take 1 tablet (15 mg total) by mouth daily.   Current Outpatient Medications (Other):  .  citalopram (CELEXA) 20 MG tablet, Take 20 mg by mouth daily. .  cyclobenzaprine (FLEXERIL) 5 MG tablet, 1 by mouth daily at bedtime .  gabapentin (NEURONTIN) 100 MG capsule, Take 1-2 capsules 3 times a day .  Multiple Vitamins-Minerals (MULTIVITAMIN WITH MINERALS) tablet, Take  1 tablet by mouth daily. .  Omega-3 Fatty Acids (FISH OIL) 1000 MG CAPS, Take 1,000 mg by mouth daily. .  ondansetron (ZOFRAN ODT) 4 MG disintegrating tablet, Take 1 tablet (4 mg total) by mouth every 8 (eight) hours as needed for nausea or vomiting. .  pantoprazole (PROTONIX) 40 MG tablet, Take 1 tablet (40 mg total) by mouth daily. Please make appointment. .  vitamin C (ASCORBIC ACID) 500 MG tablet, Take 500 mg by mouth daily. .  Vitamin D, Ergocalciferol, (DRISDOL) 50000 units CAPS capsule, Take 1 capsule (50,000 Units total) by mouth every 7 (seven) days. [name]  zolpidem (AMBIEN CR) 12.5 MG CR tablet, Take 12.5 mg by mouth at bedtime as needed for sleep.    Past medical history, social, surgical and family history all reviewed in electronic medical record.  No pertanent information unless stated regarding to the  chief complaint.   Review of Systems:  No headache, visual changes, nausea, vomiting, diarrhea, constipation, dizziness, abdominal pain, skin rash, fevers, chills, night sweats, weight loss, swollen lymph nodes, body aches, joint swelling,  chest pain, shortness of breath, mood changes.  Positive muscle aches  Objective  Blood pressure 102/60, pulse 72, height 5\' 6"  (1.676 m), weight 141 lb (64 kg), SpO2 98 %.    General: No apparent distress alert and oriented x3 mood and affect normal, dressed appropriately.  HEENT: Pupils equal, extraocular movements intact  Respiratory: Patient's speak in full sentences and does not appear short of breath  Cardiovascular: No lower extremity edema, non tender, no erythema  Skin: Warm dry intact with no signs of infection or rash on extremities or on axial skeleton.  Abdomen: Soft nontender  Neuro: Cranial nerves II through XII are intact, neurovascularly intact in all extremities with 2+ DTRs and 2+ pulses.  Lymph: No lymphadenopathy of posterior or anterior cervical chain or axillae bilaterally.  Gait normal with good balance and coordination.  MSK:  Non tender with full range of motion and good stability and symmetric strength and tone of  elbows, wrist, hip, knee and ankles bilaterally.  Shoulder: Left Inspection reveals no abnormalities, atrophy or asymmetry. Palpation is normal with no tenderness over AC joint or bicipital groove. ROM is full in all planes. Rotator cuff strength normal throughout. Mild impingement Speeds and Yergason's tests normal. No labral pathology noted with negative Obrien's, negative clunk and good stability. Normal scapular function observed. No painful arc and no drop arm sign. No apprehension sign Contralateral shoulder unremarkable    Impression and Recommendations:     This case required medical decision making of moderate complexity. The above documentation has been reviewed and is accurate and complete  Laurie Saa, DO       Note: This dictation was prepared with Dragon dictation along with smaller phrase technology. Any transcriptional errors that result from this process are unintentional.

## 2018-09-21 NOTE — Patient Instructions (Signed)
Good to see you  Ice is your friend Stay active Keep hands within peripheral vison  See em again in 6-8 weeks if not perfect

## 2018-09-21 NOTE — Assessment & Plan Note (Signed)
Significant improvement.  Consider therapy seems to be working.  Continue at this moment.  Follow-up again in 6 to 8 weeks

## 2018-11-22 ENCOUNTER — Ambulatory Visit: Payer: Self-pay | Admitting: Family Medicine

## 2019-03-19 NOTE — Progress Notes (Deleted)
Office Visit Note  Patient: Laurie Foster             Date of Birth: [birth year]           MRN: [id number]             PCP: Shaune Pollack, MD (Inactive) Referring: Carolin Coy, MD Visit Date: 03/30/2019 Occupation: @GUAROCC @  Subjective:  No chief complaint on file.   History of Present Illness: Laurie Foster is a 63 y.o. female ***   Activities of Daily Living:  Patient reports morning stiffness for *** {minute/hour:19697}.   Patient {ACTIONS;DENIES/REPORTS:21021675::"Denies"} nocturnal pain.  Difficulty dressing/grooming: {ACTIONS;DENIES/REPORTS:21021675::"Denies"} Difficulty climbing stairs: {ACTIONS;DENIES/REPORTS:21021675::"Denies"} Difficulty getting out of chair: {ACTIONS;DENIES/REPORTS:21021675::"Denies"} Difficulty using hands for taps, buttons, cutlery, and/or writing: {ACTIONS;DENIES/REPORTS:21021675::"Denies"}  No Rheumatology ROS completed.   PMFS History:  Patient Active Problem List   Diagnosis Date Noted  . Left shoulder pain 08/24/2018  . Atypical chest pain 04/28/2015    Past Medical History:  Diagnosis Date  . Atypical chest pain   . Depressive disorder   . HLD (hyperlipidemia)   . Insomnia     Family History  Problem Relation Age of Onset  . Cancer Father        gall bladder cancer  . Sudden death Mother        in sleep  . Hyperlipidemia Brother   . Hypertension Brother   . Healthy Daughter    Past Surgical History:  Procedure Laterality Date  . TONSILLECTOMY AND ADENOIDECTOMY  1963   Social History   Social History Narrative  . Not on file    There is no immunization history on file for this patient.   Objective: Vital Signs: There were no vitals taken for this visit.   Physical Exam   Musculoskeletal Exam: ***  CDAI Exam: CDAI Score: Not documented Patient Global Assessment: Not documented; Provider Global Assessment: Not documented Swollen: Not documented; Tender: Not documented Joint Exam   Not documented    There is currently no information documented on the homunculus. Go to the Rheumatology activity and complete the homunculus joint exam.  Investigation: No additional findings.  Imaging: No results found.  Recent Labs: Lab Results  Component Value Date   WBC 8.3 01/22/2018   HGB 13.3 01/22/2018   PLT 195 01/22/2018   NA 137 01/22/2018   K 3.9 01/22/2018   CL 103 01/22/2018   CO2 24 01/22/2018   GLUCOSE 141 (H) 01/22/2018   BUN 17 01/22/2018   CREATININE 0.63 01/22/2018   BILITOT 0.6 01/22/2018   ALKPHOS 49 01/22/2018   AST 20 01/22/2018   ALT 13 (L) 01/22/2018   PROT 7.4 01/22/2018   ALBUMIN 4.6 01/22/2018   CALCIUM 9.3 01/22/2018   GFRAA >60 01/22/2018    Speciality Comments: No specialty comments available.  Procedures:  No procedures performed Allergies: Penicillins   Assessment / Plan:     Visit Diagnoses: Pain in both hands  Other insomnia  Pure hypercholesterolemia  Prediabetes  History of depression   Orders: No orders of the defined types were placed in this encounter.  No orders of the defined types were placed in this encounter.   Face-to-face time spent with patient was *** minutes. Greater than 50% of time was spent in counseling and coordination of care.  Follow-Up Instructions: No follow-ups on file.   Gearldine Bienenstock, PA-C  Note - This record has been created using Dragon software.  Chart creation errors have been sought, but may not always  have  been located. Such creation errors do not reflect on  the standard of medical care.

## 2019-03-30 ENCOUNTER — Ambulatory Visit: Payer: 59 | Admitting: Rheumatology

## 2019-04-05 NOTE — Progress Notes (Signed)
Office Visit Note  Patient: Laurie Foster             Date of Birth: 1956-10-05           MRN: [id number]             PCP: Shaune Pollack, MD (Inactive) Referring: Carolin Coy, MD Visit Date: 04/19/2019 Occupation: Retired, Insurance risk surveyor  Subjective:  Pain and stiffness in joints.   History of Present Illness: Laurie Foster is a 63 y.o. female seen in consultation per request of her PCP.  According to patient her symptoms are started about 2 years ago with pain in her both hands.  She describes pain mostly over the [facility] joints.  She states she was evaluated by a hand surgeon on [employer] about 2 years ago.  She was told that she did not have carpal tunnel brace and was given some hand braces.  She was also given ibuprofen which she took for few weeks.  The symptoms improved temporarily and then recurred.  She states gradually the symptoms have been getting worse.  She is experiencing a stabbing pain which she describes over the [facility] joints.  She is also noticed decreased grip strength in her hands.  She states for the last 6 months she has been having generalized body stiffness and also increased pain in her knee joints.  She has some difficulty getting up from the chair.  She denies any history of joint swelling.  There is positive family history of rheumatoid arthritis in her mother and a cousin.  Activities of Daily Living:  Patient reports morning stiffness for 24 hour.   Patient Denies nocturnal pain.  Difficulty dressing/grooming: Reports Difficulty climbing stairs: Reports Difficulty getting out of chair: Denies Difficulty using hands for taps, buttons, cutlery, and/or writing: Reports  Review of Systems  Constitutional: Negative for fatigue, night sweats, weight gain and weight loss.  HENT: Negative for mouth sores, trouble swallowing, trouble swallowing, mouth dryness and nose dryness.   Eyes: Negative for pain, redness, visual disturbance and dryness.   Respiratory: Negative for cough, shortness of breath, wheezing and difficulty breathing.   Cardiovascular: Negative for chest pain, palpitations, hypertension, irregular heartbeat and swelling in legs/feet.  Gastrointestinal: Negative for blood in stool, constipation and diarrhea.  Endocrine: Negative for excessive thirst and increased urination.  Genitourinary: Negative for difficulty urinating and vaginal dryness.  Musculoskeletal: Positive for arthralgias, joint pain, muscle weakness and morning stiffness. Negative for joint swelling, myalgias, muscle tenderness and myalgias.  Skin: Negative for color change, rash, hair loss, redness, skin tightness, ulcers and sensitivity to sunlight.  Allergic/Immunologic: Negative for susceptible to infections.  Neurological: Negative for dizziness, headaches, memory loss, night sweats and weakness.  Hematological: Negative for bruising/bleeding tendency and swollen glands.  Psychiatric/Behavioral: Positive for sleep disturbance. Negative for depressed mood. The patient is not nervous/anxious.     PMFS History:  Patient Active Problem List   Diagnosis Date Noted  . Left shoulder pain 08/24/2018  . Atypical chest pain 04/28/2015    Past Medical History:  Diagnosis Date  . Atypical chest pain   . Depressive disorder   . HLD (hyperlipidemia)   . Insomnia     Family History  Problem Relation Age of Onset  . Cancer Father        gall bladder cancer  . Sudden death Mother        in sleep  . High Cholesterol Brother   . Hyperlipidemia Brother   . Healthy Daughter  Past Surgical History:  Procedure Laterality Date  . HERNIA REPAIR    . TONSILLECTOMY AND ADENOIDECTOMY  1963   Social History   Social History Narrative  . Not on file    There is no immunization history on file for this patient.   Objective: Vital Signs: BP (!) 102/58 (BP Location: Right Arm, Patient Position: Sitting, Cuff Size: Normal)   Pulse 65   Resp 11   Ht 5'  5.5" (1.664 m)   Wt 142 lb (64.4 kg)   BMI 23.27 kg/m    Physical Exam Vitals signs and nursing note reviewed.  Constitutional:      Appearance: She is well-developed.  HENT:     Head: Normocephalic and atraumatic.  Eyes:     Conjunctiva/sclera: Conjunctivae normal.  Neck:     Musculoskeletal: Normal range of motion.  Cardiovascular:     Rate and Rhythm: Normal rate and regular rhythm.     Heart sounds: Normal heart sounds.  Pulmonary:     Effort: Pulmonary effort is normal.     Breath sounds: Normal breath sounds.  Abdominal:     General: Bowel sounds are normal.     Palpations: Abdomen is soft.  Lymphadenopathy:     Cervical: No cervical adenopathy.  Skin:    General: Skin is warm and dry.     Capillary Refill: Capillary refill takes less than 2 seconds.  Neurological:     Mental Status: She is alert and oriented to person, place, and time.  Psychiatric:        Behavior: Behavior normal.      Musculoskeletal Exam: Patient had good range of motion of her cervical spine.  Lumbar spine range of motion was limited due to tight hamstrings.  Shoulder joints elbow joints wrist joints were in good range of motion.  She had bilateral CMC thickening more prominent on the right side.  No MCP PIP or DIP swelling was noted.  She had painful range of motion of her right hip joint and bilateral knee joints.  No warmth swelling or effusion was noted.  She has bilateral hallux valgus deformity.  CDAI Exam: CDAI Score: - Patient Global: -; Provider Global: - Swollen: -; Tender: - Joint Exam   No joint exam has been documented for this visit   There is currently no information documented on the homunculus. Go to the Rheumatology activity and complete the homunculus joint exam.  Investigation: No additional findings.  Imaging: Xr Hip Unilat W Or W/o Pelvis 2-3 Views Right  Result Date: 04/19/2019 Moderate superior lateral joint space narrowing was noted.  No SI joint narrowing was  noted.  No chondrocalcinosis was noted. Impression: These findings are consistent with moderate osteoarthritis of the hip joint.  Xr Hand 2 View Left  Result Date: 04/19/2019 CMC PIP narrowing was noted.  No MCP intercarpal radiocarpal joint space narrowing was noted.  No erosive changes were noted. 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  Xr Hand 2 View Right  Result Date: 04/19/2019 CMC PIP and DIP joint space narrowing was noted.  No MCP intercarpal radiocarpal joint space narrowing was noted.  No erosive changes were noted. Impression: These findings are consistent with osteoarthritis of the hand.  She has CMC arthropathy and spurring.  Xr Knee 3 View Left  Result Date: 04/19/2019 Moderate medial compartment narrowing was noted.  No chondrocalcinosis was noted.  Moderate patellofemoral narrowing was noted. Impression: These findings are consistent with  moderate osteoarthritis and moderate chondromalacia of the knee joint.  Xr Knee 3 View Right  Result Date: 04/19/2019 Moderate medial compartment narrowing was noted.  No chondrocalcinosis was noted.  Moderate patellofemoral narrowing was noted. 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   Recent Labs: Lab Results  Component Value Date   WBC 8.3 01/22/2018   HGB 13.3 01/22/2018   PLT 195 01/22/2018   NA 137 01/22/2018   K 3.9 01/22/2018   CL 103 01/22/2018   CO2 24 01/22/2018   GLUCOSE 141 (H) 01/22/2018   BUN 17 01/22/2018   CREATININE 0.63 01/22/2018   BILITOT 0.6 01/22/2018   ALKPHOS 49 01/22/2018   AST 20 01/22/2018   ALT 13 (L) 01/22/2018   PROT 7.4 01/22/2018   ALBUMIN 4.6 01/22/2018   CALCIUM 9.3 01/22/2018   GFRAA >60 01/22/2018    Speciality Comments: No specialty comments available.  Procedures:  No procedures performed Allergies: Penicillins   Assessment / Plan:      Visit Diagnoses: Pain in both hands -patient complains of pain in her bilateral hands over the last 2 years.  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  The clinical findings are consistent with osteoarthritis.  There is positive family history of rheumatoid arthritis.  Right CMC brace was advised.  She can also use topical diclofenac gel.  Natural anti-inflammatories were discussed.  We [facility] also [facility].  Plan: XR Hand 2 View Right, XR Hand 2 View Left, x-rays were consistent with osteoarthritis.  She may successfully have CMC arthropathy most prominent on the right side.  She may benefit from right CMC injection if her discomfort persist.  Sedimentation rate, Uric acid, Rheumatoid factor, Cyclic citrul peptide antibody, IgG, 14-3-3 eta Protein  Chronic left shoulder pain -she has intermittent pain.  Pain in right hip -she has painful range of motion of her right hip joint.  A handout on hip joint exercise was given.  Plan: XR HIP UNILAT W OR W/O PELVIS 2-3 VIEWS RIGHT, she has moderate osteoarthritis in her right hip joint.  Chronic pain of both knees -she complains of discomfort in her bilateral knee joints and difficulty getting off the chair.  No warmth swelling or effusion was noted.  A handout on knee joint exercises was given.  Plan: XR KNEE 3 VIEW RIGHT, XR KNEE 3 VIEW LEFT, bilateral moderate osteoarthritis and moderate chondromalacia patella was noted.  Handout on knee exercises was given.  She can use topical Voltaren gel.  Family history of rheumatoid arthritis - Mother and 1st cousin   Other insomnia -patient is on Ambien.  History of gastroesophageal reflux (GERD) -she has intermittent problems with reflux but is doing well currently.  Pure hypercholesterolemia -patient is on no medications.  Prediabetes -she practices dietary management.  History of depression - Plan: She is on Celexa.  Orders: Orders  Placed This Encounter  Procedures  . XR Hand 2 View Right  . XR Hand 2 View Left  . XR HIP UNILAT W OR W/O PELVIS 2-3 VIEWS RIGHT  . XR KNEE 3 VIEW RIGHT  . XR KNEE 3 VIEW LEFT  . Sedimentation rate  . Uric acid  . Rheumatoid factor  . Cyclic citrul peptide antibody, IgG  . 14-3-3 eta Protein   No orders of the defined types were placed in this encounter.   Face-to-face time spent with patient was 45  minutes. Greater than 50% of time was spent in counseling and coordination of care.  Follow-Up Instructions: Return in about 6 months (around 10/19/2019) for OA.   Pollyann Savoy, MD  Note - This record has been created using Animal nutritionist.  Chart creation errors have been sought, but may not always  have been located. Such creation errors do not reflect on  the standard of medical care.

## 2019-04-19 ENCOUNTER — Ambulatory Visit (INDEPENDENT_AMBULATORY_CARE_PROVIDER_SITE_OTHER): Payer: 59

## 2019-04-19 ENCOUNTER — Encounter: Payer: Self-pay | Admitting: Rheumatology

## 2019-04-19 ENCOUNTER — Ambulatory Visit: Payer: Self-pay

## 2019-04-19 ENCOUNTER — Other Ambulatory Visit: Payer: Self-pay

## 2019-04-19 ENCOUNTER — Ambulatory Visit (INDEPENDENT_AMBULATORY_CARE_PROVIDER_SITE_OTHER): Payer: 59 | Admitting: Rheumatology

## 2019-04-19 VITALS — BP 102/58 | HR 65 | Resp 11 | Ht 65.5 in | Wt 142.0 lb

## 2019-04-19 DIAGNOSIS — G8929 Other chronic pain: Secondary | ICD-10-CM

## 2019-04-19 DIAGNOSIS — M25551 Pain in right hip: Secondary | ICD-10-CM | POA: Diagnosis not present

## 2019-04-19 DIAGNOSIS — Z8261 Family history of arthritis: Secondary | ICD-10-CM

## 2019-04-19 DIAGNOSIS — M25561 Pain in right knee: Secondary | ICD-10-CM | POA: Diagnosis not present

## 2019-04-19 DIAGNOSIS — M25562 Pain in left knee: Secondary | ICD-10-CM

## 2019-04-19 DIAGNOSIS — E7800 Pure hypercholesterolemia, unspecified: Secondary | ICD-10-CM

## 2019-04-19 DIAGNOSIS — G4709 Other insomnia: Secondary | ICD-10-CM

## 2019-04-19 DIAGNOSIS — M79641 Pain in right hand: Secondary | ICD-10-CM

## 2019-04-19 DIAGNOSIS — Z8659 Personal history of other mental and behavioral disorders: Secondary | ICD-10-CM

## 2019-04-19 DIAGNOSIS — M79642 Pain in left hand: Secondary | ICD-10-CM | POA: Diagnosis not present

## 2019-04-19 DIAGNOSIS — M25512 Pain in left shoulder: Secondary | ICD-10-CM | POA: Diagnosis not present

## 2019-04-19 DIAGNOSIS — Z8719 Personal history of other diseases of the digestive system: Secondary | ICD-10-CM

## 2019-04-19 DIAGNOSIS — R7303 Prediabetes: Secondary | ICD-10-CM

## 2019-04-19 NOTE — Patient Instructions (Signed)
Knee Exercises Ask your health care provider which exercises are safe for you. Do exercises exactly as told by your health care provider and adjust them as directed. It is normal to feel mild stretching, pulling, tightness, or discomfort as you do these exercises, but you should stop right away if you feel sudden pain or your pain gets worse.Do not begin these exercises until told by your health care provider. STRETCHING AND RANGE OF MOTION EXERCISES These exercises warm up your muscles and joints and improve the movement and flexibility of your knee. These exercises also help to relieve pain, numbness, and tingling. Exercise A: Knee Extension, Prone 1. Lie on your abdomen on a bed. 2. Place your left / right knee just beyond the edge of the surface so your knee is not on the bed. You can put a towel under your left / right thigh just above your knee for comfort. 3. Relax your leg muscles and allow gravity to straighten your knee. You should feel a stretch behind your left / right knee. 4. Hold this position for __________ seconds. 5. Scoot up so your knee is supported between repetitions. Repeat __________ times. Complete this stretch __________ times a day. Exercise B: Knee Flexion, Active  1. Lie on your back with both knees straight. If this causes back discomfort, bend your left / right knee so your foot is flat on the floor. 2. Slowly slide your left / right heel back toward your buttocks until you feel a gentle stretch in the front of your knee or thigh. 3. Hold this position for __________ seconds. 4. Slowly slide your left / right heel back to the starting position. Repeat __________ times. Complete this exercise __________ times a day. Exercise C: Quadriceps, Prone  1. Lie on your abdomen on a firm surface, such as a bed or padded floor. 2. Bend your left / right knee and hold your ankle. If you cannot reach your ankle or pant leg, loop a belt around your foot and grab the belt  instead. 3. Gently pull your heel toward your buttocks. Your knee should not slide out to the side. You should feel a stretch in the front of your thigh and knee. 4. Hold this position for __________ seconds. Repeat __________ times. Complete this stretch __________ times a day. Exercise D: Hamstring, Supine 1. Lie on your back. 2. Loop a belt or towel over the ball of your left / right foot. The ball of your foot is on the walking surface, right under your toes. 3. Straighten your left / right knee and slowly pull on the belt to raise your leg until you feel a gentle stretch behind your knee. ? Do not let your left / right knee bend while you do this. ? Keep your other leg flat on the floor. 4. Hold this position for __________ seconds. Repeat __________ times. Complete this stretch __________ times a day. STRENGTHENING EXERCISES These exercises build strength and endurance in your knee. Endurance is the ability to use your muscles for a long time, even after they get tired. Exercise E: Quadriceps, Isometric  1. Lie on your back with your left / right leg extended and your other knee bent. Put a rolled towel or small pillow under your knee if told by your health care provider. 2. Slowly tense the muscles in the front of your left / right thigh. You should see your kneecap slide up toward your hip or see increased dimpling just above the knee. This  motion will push the back of the knee toward the floor. 3. For __________ seconds, keep the muscle as tight as you can without increasing your pain. 4. Relax the muscles slowly and completely. Repeat __________ times. Complete this exercise __________ times a day. Exercise F: Straight Leg Raises - Quadriceps 1. Lie on your back with your left / right leg extended and your other knee bent. 2. Tense the muscles in the front of your left / right thigh. You should see your kneecap slide up or see increased dimpling just above the knee. Your thigh may  even shake a bit. 3. Keep these muscles tight as you raise your leg 4-6 inches (10-15 cm) off the floor. Do not let your knee bend. 4. Hold this position for __________ seconds. 5. Keep these muscles tense as you lower your leg. 6. Relax your muscles slowly and completely after each repetition. Repeat __________ times. Complete this exercise __________ times a day. Exercise G: Hamstring, Isometric 1. Lie on your back on a firm surface. 2. Bend your left / right knee approximately __________ degrees. 3. Dig your left / right heel into the surface as if you are trying to pull it toward your buttocks. Tighten the muscles in the back of your thighs to dig as hard as you can without increasing any pain. 4. Hold this position for __________ seconds. 5. Release the tension gradually and allow your muscles to relax completely for __________ seconds after each repetition. Repeat __________ times. Complete this exercise __________ times a day. Exercise H: Hamstring Curls  If told by your health care provider, do this exercise while wearing ankle weights. Begin with __________ weights. Then increase the weight by 1 lb (0.5 kg) increments. Do not wear ankle weights that are more than __________. 1. Lie on your abdomen with your legs straight. 2. Bend your left / right knee as far as you can without feeling pain. Keep your hips flat against the floor. 3. Hold this position for __________ seconds. 4. Slowly lower your leg to the starting position.  Repeat __________ times. Complete this exercise __________ times a day. Exercise I: Squats (Quadriceps) 1. Stand in front of a table, with your feet and knees pointing straight ahead. You may rest your hands on the table for balance but not for support. 2. Slowly bend your knees and lower your hips like you are going to sit in a chair. ? Keep your weight over your heels, not over your toes. ? Keep your lower legs upright so they are parallel with the table  legs. ? Do not let your hips go lower than your knees. ? Do not bend lower than told by your health care provider. ? If your knee pain increases, do not bend as low. 3. Hold the squat position for __________ seconds. 4. Slowly push with your legs to return to standing. Do not use your hands to pull yourself to standing. Repeat __________ times. Complete this exercise __________ times a day. Exercise J: Wall Slides (Quadriceps)  1. Lean your back against a smooth wall or door while you walk your feet out 18-24 inches (46-61 cm) from it. 2. Place your feet hip-width apart. 3. Slowly slide down the wall or door until your knees bend __________ degrees. Keep your knees over your heels, not over your toes. Keep your knees in line with your hips. 4. Hold for __________ seconds. Repeat __________ times. Complete this exercise __________ times a day. Exercise K: Straight Leg Raises -  Hip Abductors 1. Lie on your side with your left / right leg in the top position. Lie so your head, shoulder, knee, and hip line up. You may bend your bottom knee to help you keep your balance. 2. Roll your hips slightly forward so your hips are stacked directly over each other and your left / right knee is facing forward. 3. Leading with your heel, lift your top leg 4-6 inches (10-15 cm). You should feel the muscles in your outer hip lifting. ? Do not let your foot drift forward. ? Do not let your knee roll toward the ceiling. 4. Hold this position for __________ seconds. 5. Slowly return your leg to the starting position. 6. Let your muscles relax completely after each repetition. Repeat __________ times. Complete this exercise __________ times a day. Exercise L: Straight Leg Raises - Hip Extensors 1. Lie on your abdomen on a firm surface. You can put a pillow under your hips if that is more comfortable. 2. Tense the muscles in your buttocks and lift your left / right leg about 4-6 inches (10-15 cm). Keep your knee  straight as you lift your leg. 3. Hold this position for __________ seconds. 4. Slowly lower your leg to the starting position. 5. Let your leg relax completely after each repetition. Repeat __________ times. Complete this exercise __________ times a day. This information is not intended to replace advice given to you by your health care provider. Make sure you discuss any questions you have with your health care provider. Document Released: 09/01/2005 Document Revised: 07/12/2016 Document Reviewed: 08/24/2015 [employer] Interactive Patient Education  2018 [employer] Hip Exercises Ask your health care provider which exercises are safe for you. Do exercises exactly as told by your health care provider and adjust them as directed. It is normal to feel mild stretching, pulling, tightness, or discomfort as you do these exercises, but you should stop right away if you feel sudden pain or your pain gets worse.Do not begin these exercises until told by your health care provider. Stretching and range of motion exercises These exercises warm up your muscles and joints and improve the movement and flexibility of your hip. These exercises also help to relieve pain, numbness, and tingling. Exercise A: Hamstrings, supine  1. Lie on your back. 2. Loop a belt or towel over the ball of your left / rightfoot. The ball of your foot is on the walking surface, right under your toes. 3. Straighten your left / rightknee and slowly pull on the belt to raise your leg. ? Do not let your left / right knee bend while you do this. ? Keep your other leg flat on the floor. ? Raise the left / right leg until you feel a gentle stretch behind your left / right knee or thigh. 4. Hold this position for __________ seconds. 5. Slowly return your leg to the starting position. Repeat __________ times. Complete this stretch __________ times a day. Exercise B: Hip rotators  1. Lie on your back on a firm surface. 2. Hold your  left / right knee with your left / right hand. Hold your ankle with your other hand. 3. Gently pull your left / right knee and rotate your lower leg toward your other shoulder. ? Pull until you feel a stretch in your buttocks. ? Keep your hips and shoulders firmly planted while you do this stretch. 4. Hold this position for __________ seconds. Repeat __________ times. Complete this stretch __________ times a day. Exercise  C: V-sit (hamstrings and adductors)  1. Sit on the floor with your legs extended in a large "V" shape. Keep your knees straight during this exercise. 2. Start with your head and chest upright, then bend at your waist to reach for your left foot (position A). You should feel a stretch in your right inner thigh. 3. Hold this position for __________ seconds. Then slowly return to the upright position. 4. Bend at your waist to reach forward (position B). You should feel a stretch behind both of your thighs and knees. 5. Hold this position for __________ seconds. Then slowly return to the upright position. 6. Bend at your waist to reach for your right foot (position C). You should feel a stretch in your left inner thigh. 7. Hold this position for __________ seconds. Then slowly return to the upright position. Repeat __________ times. Complete this stretch __________ times a day. Exercise D: Lunge (hip flexors)  1. Place your left / right knee on the floor and bend your other knee so that is directly over your ankle. You should be half-kneeling. 2. Keep good posture with your head over your shoulders. 3. Tighten your buttocks to point your tailbone downward. This helps your back to keep from arching too much. 4. You should feel a gentle stretch in the front of your left / right thigh and hip. If you do not feel any resistance, slightly slide your other foot forward and then slowly lunge forward so your knee once again lines up over your ankle. 5. Make sure your tailbone continues to  point downward. 6. Hold this position for __________ seconds. Repeat __________ times. Complete this stretch __________ times a day. Strengthening exercises These exercises build strength and endurance in your hip. Endurance is the ability to use your muscles for a long time, even after they get tired. Exercise E: Bridge (hip extensors)  1. Lie on your back on a firm surface with your knees bent and your feet flat on the floor. 2. Tighten your buttocks muscles and lift your bottom off the floor until the trunk of your body is level with your thighs. ? Do not arch your back. ? You should feel the muscles working in your buttocks and the back of your thighs. If you do not feel these muscles, slide your feet 1-2 inches (2.5-5 cm) farther away from your buttocks. 3. Hold this position for __________ seconds. 4. Slowly lower your hips to the starting position. 5. Let your muscles relax completely between repetitions. 6. If this exercise is too easy, try doing it with your arms crossed over your chest. Repeat __________ times. Complete this exercise __________ times a day. Exercise F: Straight leg raises - hip abductors  1. Lie on your side with your left / right leg in the top position. Lie so your head, shoulder, knee, and hip line up with each other. You may bend your bottom knee to help you balance. 2. Roll your hips slightly forward, so your hips are stacked directly over each other and your left / right knee is facing forward. 3. Leading with your heel, lift your top leg 4-6 inches (10-15 cm). You should feel the muscles in your outer hip lifting. ? Do not let your foot drift forward. ? Do not let your knee roll toward the ceiling. 4. Hold this position for __________ seconds. 5. Slowly return to the starting position. 6. Let your muscles relax completely between repetitions. Repeat __________ times. Complete this exercise __________ times  a day. Exercise G: Straight leg raises - hip  adductors  1. Lie on your side with your left / right leg in the bottom position. Lie so your head, shoulder, knee, and hip line up. You may place your upper foot in front to help you balance. 2. Roll your hips slightly forward, so your hips are stacked directly over each other and your left / right knee is facing forward. 3. Tense the muscles in your inner thigh and lift your bottom leg 4-6 inches (10-15 cm). 4. Hold this position for __________ seconds. 5. Slowly return to the starting position. 6. Let your muscles relax completely between repetitions. Repeat __________ times. Complete this exercise __________ times a day. Exercise H: Straight leg raises - quadriceps  1. Lie on your back with your left / right leg extended and your other knee bent. 2. Tense the muscles in the front of your left / right thigh. When you do this, you should see your kneecap slide up or see increased dimpling just above your knee. 3. Tighten these muscles even more and raise your leg 4-6 inches (10-15 cm) off the floor. 4. Hold this position for __________ seconds. 5. Keep these muscles tense as you lower your leg. 6. Relax the muscles slowly and completely between repetitions. Repeat __________ times. Complete this exercise __________ times a day. Exercise I: Hip abductors, standing 1. Tie one end of a rubber exercise band or tubing to a secure surface, such as a table or pole. 2. Loop the other end of the band or tubing around your left / right ankle. 3. Keeping your ankle with the band or tubing directly opposite of the secured end, step away until there is tension in the tubing or band. Hold onto a chair as needed for balance. 4. Lift your left / right leg out to your side. While you do this: ? Keep your back upright. ? Keep your shoulders over your hips. ? Keep your toes pointing forward. ? Make sure to use your hip muscles to lift your leg. Do not "throw" your leg or tip your body to lift your  leg. 5. Hold this position for __________ seconds. 6. Slowly return to the starting position. Repeat __________ times. Complete this exercise __________ times a day. Exercise J: Squats (quadriceps) 1. Stand in a door frame so your feet and knees are in line with the frame. You may place your hands on the frame for balance. 2. Slowly bend your knees and lower your hips like you are going to sit in a chair. ? Keep your lower legs in a straight-up-and-down position. ? Do not let your hips go lower than your knees. ? Do not bend your knees lower than told by your health care provider. ? If your hip pain increases, do not bend as low. 3. Hold this position for ___________ seconds. 4. Slowly push with your legs to return to standing. Do not use your hands to pull yourself to standing. Repeat __________ times. Complete this exercise __________ times a day. This information is not intended to replace advice given to you by your health care provider. Make sure you discuss any questions you have with your health care provider. Document Released: 11/05/2005 Document Revised: 02/21/2018 Document Reviewed: 10/13/2015 [employer] Interactive Patient Education  2019 [employer] Hand Exercises Hand exercises can be helpful to almost anyone. These exercises can strengthen the hands, improve flexibility and movement, and increase blood flow to the hands. These results can make work  and daily tasks easier. Hand exercises can be especially helpful for people who have joint pain from arthritis or have nerve damage from overuse (carpal tunnel syndrome). These exercises can also help people who have injured a hand. Most of these hand exercises are fairly gentle stretching routines. You can do them often throughout the day. Still, it is a good idea to ask your health care provider which exercises would be best for you. Warming your hands before exercise may help to reduce stiffness. You can do this with gentle massage or  by placing your hands in warm water for 15 minutes. Also, make sure you pay attention to your level of hand pain as you begin an exercise routine. Exercises Knuckle bend Repeat this exercise 5-10 times with each hand. 1. Stand or sit with your arm, hand, and all five fingers pointed straight up. Make sure your wrist is straight. 2. Gently and slowly bend your fingers down and inward until the tips of your fingers are touching the tops of your palm. 3. Hold this position for a few seconds. 4. Extend your fingers out to their original position, all pointing straight up again. Finger fan Repeat this exercise 5-10 times with each hand. 1. Hold your arm and hand out in front of you. Keep your wrist straight. 2. Squeeze your hand into a fist. 3. Hold this position for a few seconds. 4. Edison Simon out, or spread apart, your hand and fingers as much as possible, stretching every joint fully. Tabletop Repeat this exercise 5-10 times with each hand. 1. Stand or sit with your arm, hand, and all five fingers pointed straight up. Make sure your wrist is straight. 2. Gently and slowly bend your fingers at the knuckles where they meet the hand until your hand is making an upside-down L shape. Your fingers should form a tabletop. 3. Hold this position for a few seconds. 4. Extend your fingers out to their original position, all pointing straight up again. Making Os Repeat this exercise 5-10 times with each hand. 1. Stand or sit with your arm, hand, and all five fingers pointed straight up. Make sure your wrist is straight. 2. Make an O shape by touching your pointer finger to your thumb. Hold for a few seconds. Then open your hand wide. 3. Repeat this motion with each finger on your hand. Table spread Repeat this exercise 5-10 times with each hand. 1. Place your hand on a table with your palm facing down. Make sure your wrist is straight. 2. Spread your fingers out as much as possible. Hold this position for a  few seconds. 3. Slide your fingers back together again. Hold for a few seconds. Ball grip Repeat this exercise 10-15 times with each hand. 1. Hold a tennis ball or another soft ball in your hand. 2. While slowly increasing pressure, squeeze the ball as hard as possible. 3. Squeeze as hard as you can for 3-5 seconds. 4. Relax and repeat.  Wrist curls Repeat this exercise 10-15 times with each hand. 1. Sit in a chair that has armrests. 2. Hold a light weight in your hand, such as a dumbbell that weighs 1-3 pounds (0.5-1.4 kg). Ask your health care provider what weight would be best for you. 3. Rest your hand just over the end of the chair arm with your palm facing up. 4. Gently pivot your wrist up and down while holding the weight. Do not twist your wrist from side to side. Contact a health care provider  if:  Your hand pain or discomfort gets much worse when you do an exercise.  Your hand pain or discomfort does not improve within 2 hours after you exercise. If you have any of these problems, stop doing these exercises right away. Do not do them again unless your health care provider says that you can. Get help right away if:  You develop sudden, severe hand pain. If this happens, stop doing these exercises right away. Do not do them again unless your health care provider says that you can. This information is not intended to replace advice given to you by your health care provider. Make sure you discuss any questions you have with your health care provider. Document Released: 09/29/2015 Document Revised: 02/21/2018 Document Reviewed: 04/28/2015 [employer] Interactive Patient Education  2019 [employer].

## 2019-04-27 LAB — SEDIMENTATION RATE: Sed Rate: 11 mm/h (ref 0–30)

## 2019-04-27 LAB — RHEUMATOID FACTOR: Rhuematoid fact SerPl-aCnc: <14 IU/mL (ref <14)

## 2019-04-27 LAB — URIC ACID: Uric Acid, Serum: 4.3 mg/dL (ref 2.5–7.0)

## 2019-04-27 LAB — 14-3-3 ETA PROTEIN: 14-3-3 eta Protein: <0.2 ng/mL (ref <0.2)

## 2019-04-27 LAB — CYCLIC CITRUL PEPTIDE ANTIBODY, IGG: Cyclic Citrullin Peptide Ab: <16 UNITS

## 2019-05-01 ENCOUNTER — Ambulatory Visit: Payer: 59 | Admitting: Rheumatology

## 2019-06-21 DIAGNOSIS — M79644 Pain in right finger(s): Secondary | ICD-10-CM | POA: Insufficient documentation

## 2019-10-18 ENCOUNTER — Ambulatory Visit: Payer: 59 | Admitting: Rheumatology

## 2019-11-23 ENCOUNTER — Ambulatory Visit: Payer: 59 | Admitting: Rheumatology

## 2019-12-23 IMAGING — DX DG SHOULDER 2+V*L*
3 series · 3 of 3 positions shown · non-contrast
Comparison: None.

CLINICAL DATA: Fall May 03, 2018.  Pain.

EXAM:
LEFT SHOULDER - 2+ VIEW

[grashey]
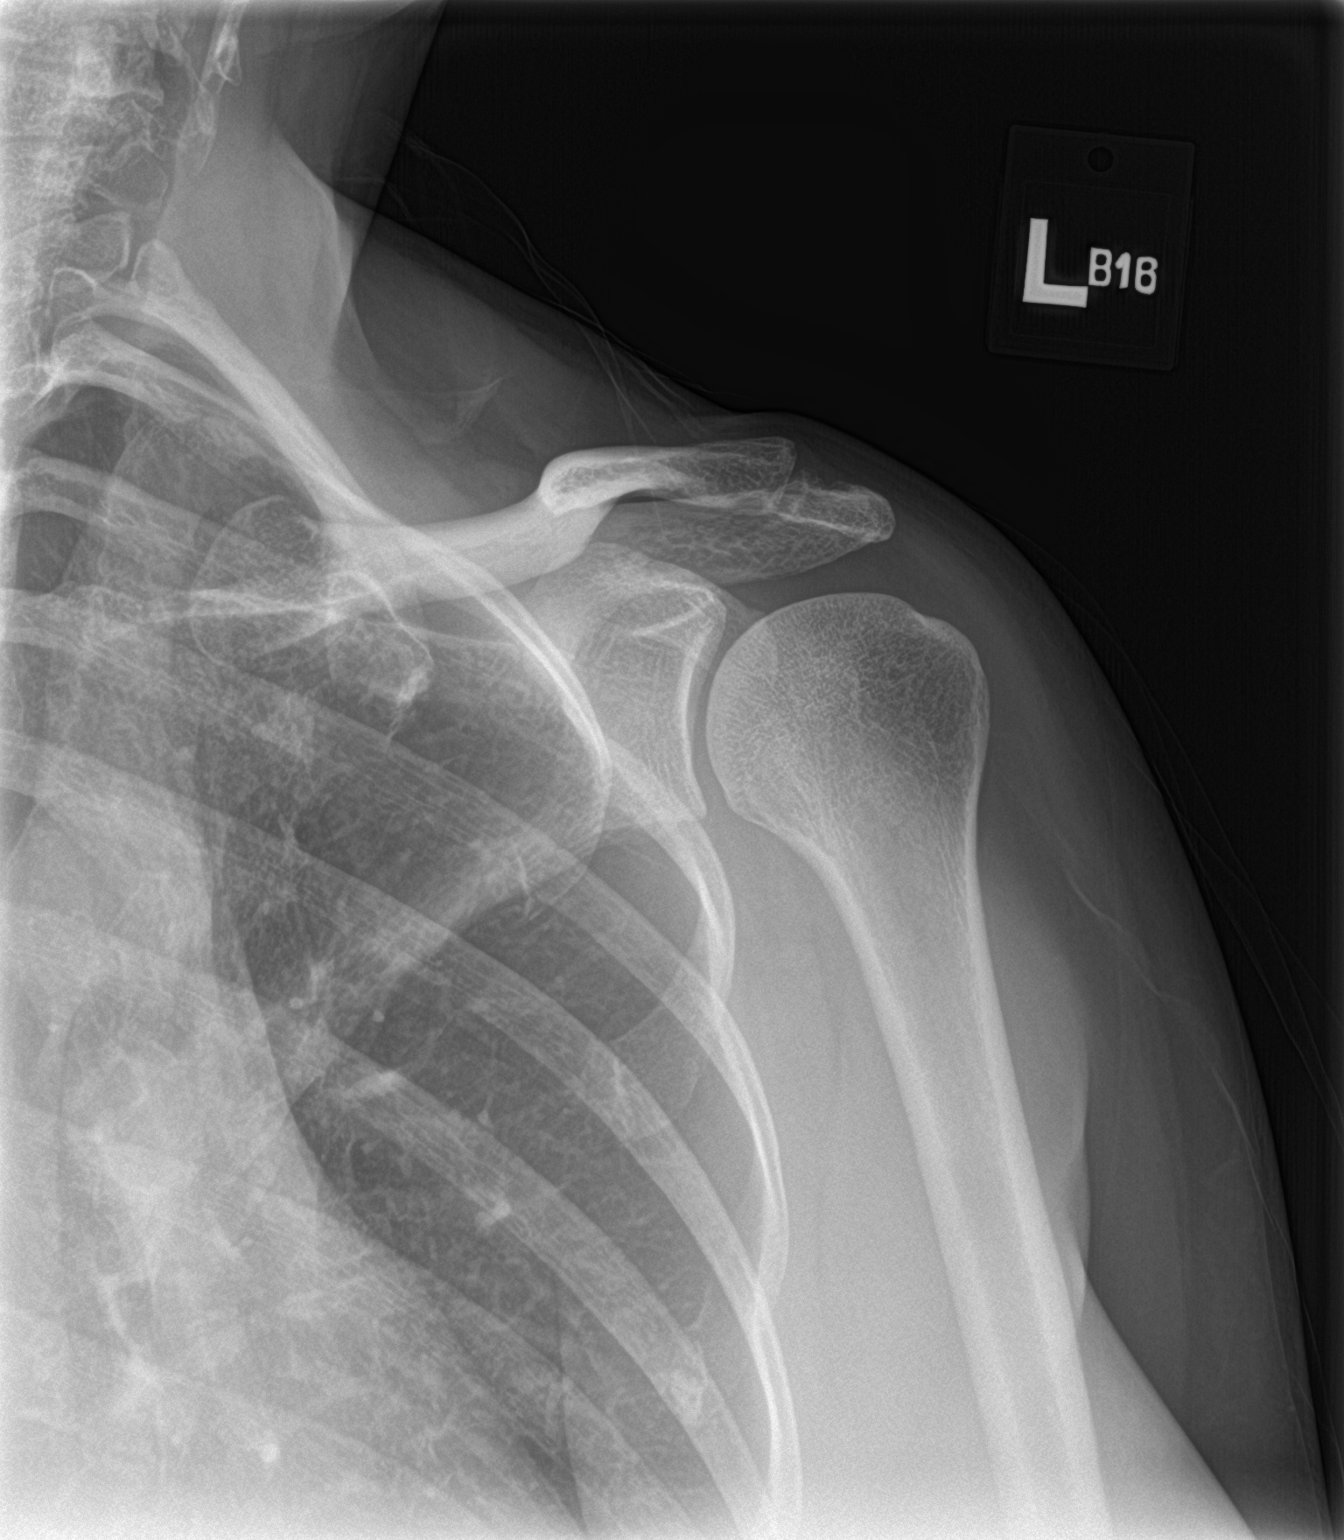

[y view]
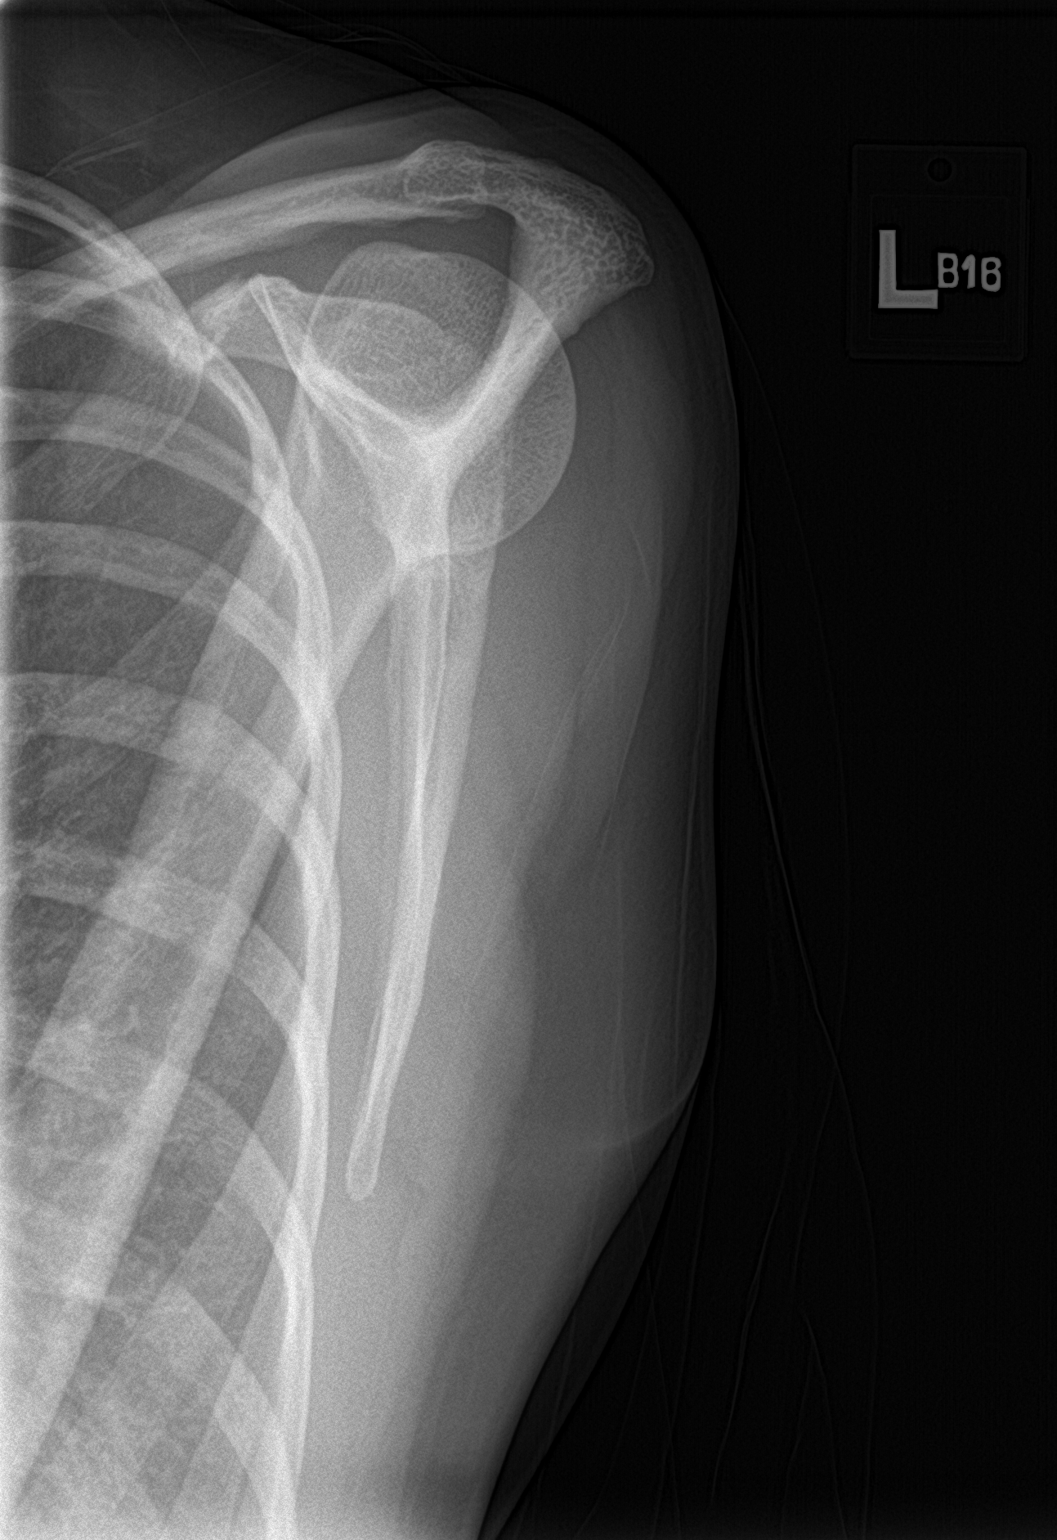

[shoulder axial]
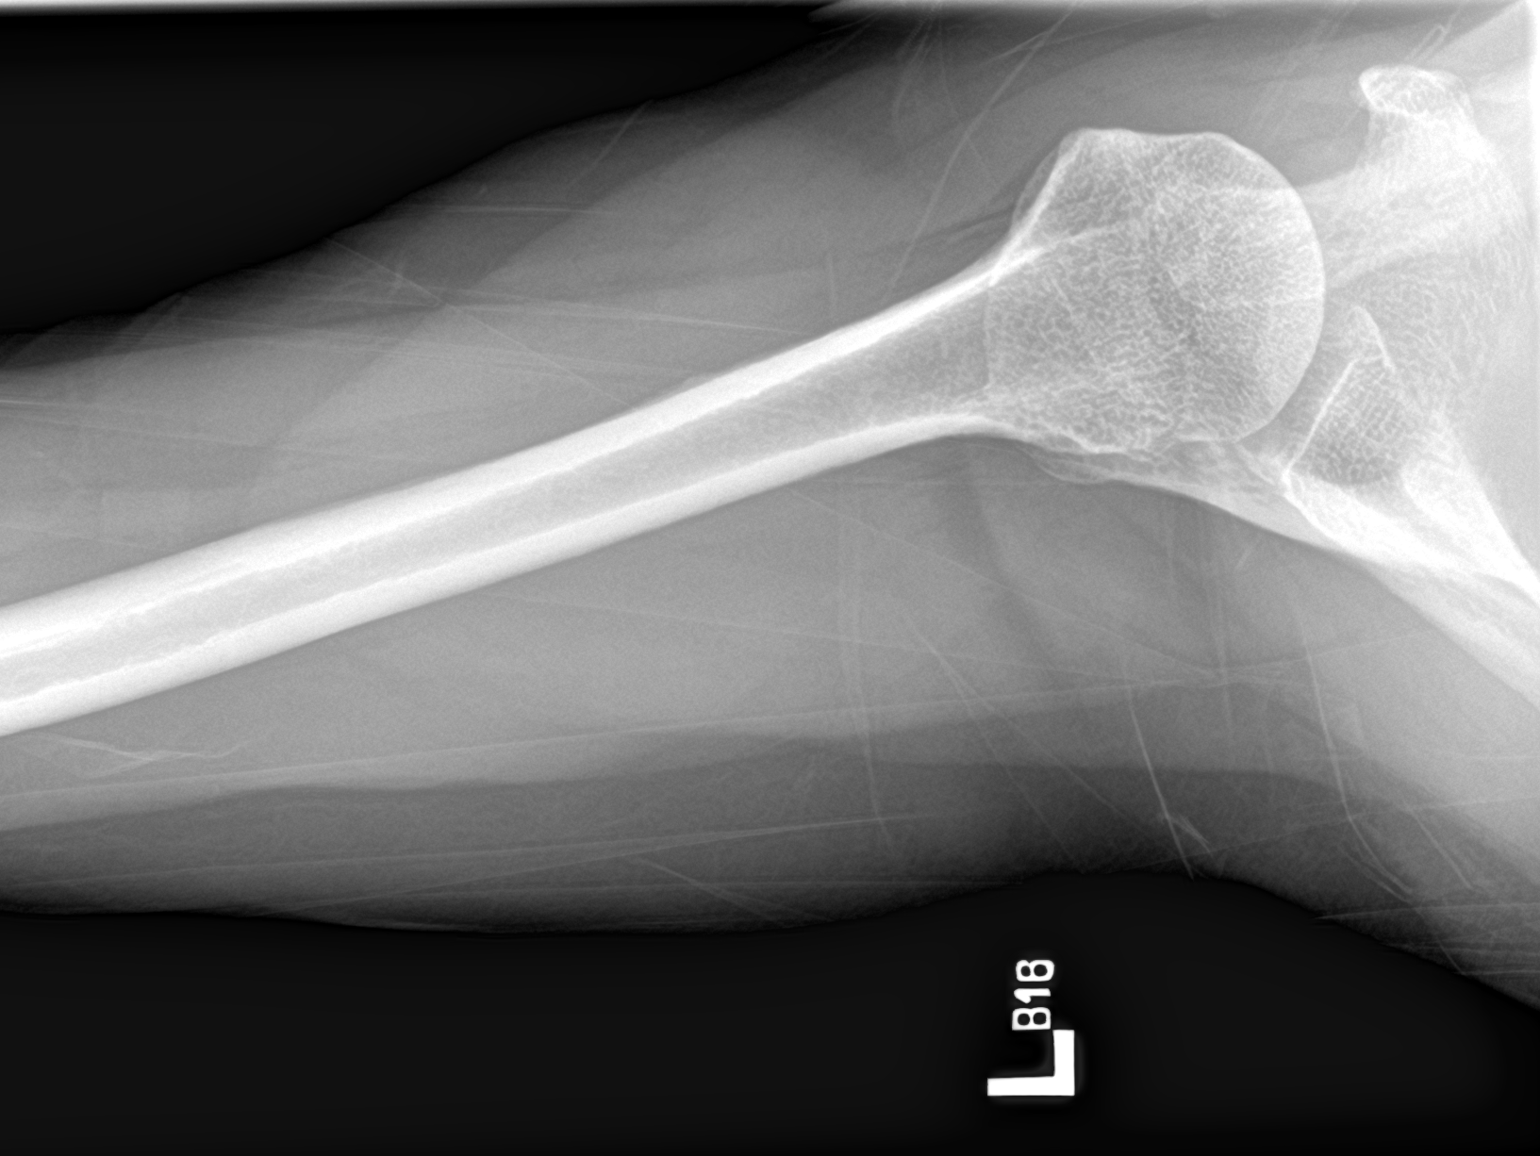

[3 of 3 positions shown; findings below may reference images not displayed]

FINDINGS: There is no evidence of fracture or dislocation. There is no
evidence of arthropathy or other focal bone abnormality. Soft
tissues are unremarkable.
IMPRESSION: Negative.

## 2020-01-08 ENCOUNTER — Ambulatory Visit: Payer: 59 | Admitting: Rheumatology

## 2020-01-15 DIAGNOSIS — M25561 Pain in right knee: Secondary | ICD-10-CM | POA: Insufficient documentation

## 2020-02-19 ENCOUNTER — Ambulatory Visit: Payer: 59 | Admitting: Physical Therapy

## 2020-05-07 ENCOUNTER — Other Ambulatory Visit: Payer: Self-pay

## 2020-05-07 ENCOUNTER — Encounter: Payer: Self-pay | Admitting: Physical Therapy

## 2020-05-07 ENCOUNTER — Ambulatory Visit: Payer: Managed Care, Other (non HMO) | Attending: Orthopedic Surgery | Admitting: Physical Therapy

## 2020-05-07 DIAGNOSIS — M25551 Pain in right hip: Secondary | ICD-10-CM | POA: Diagnosis present

## 2020-05-07 DIAGNOSIS — R262 Difficulty in walking, not elsewhere classified: Secondary | ICD-10-CM | POA: Diagnosis present

## 2020-05-07 NOTE — Patient Instructions (Signed)
Access Code: [email or web address] URL: [email or web address] Date: 05/07/2020 Prepared by: Stacie Glaze  Exercises Clamshell - 2 x daily - 7 x weekly - 1 sets - 10 reps - 3 hold Sidelying Hip Abduction - 2 x daily - 7 x weekly - 1 sets - 10 reps - 3 hold Standing Hip Abduction AROM - 2 x daily - 7 x weekly - 1 sets - 10 reps - 3 hold Supine Bridge - 2 x daily - 7 x weekly - 1 sets - 10 reps - 3 hold Bent Knee Fallouts - 2 x daily - 7 x weekly - 1 sets - 10 reps - 3 hold

## 2020-05-07 NOTE — Therapy (Signed)
[facility] [place] [address] [place], [place], [address] Phone: [phone]   Fax:  [phone]  Physical Therapy Evaluation  Patient Details  Name: Laurie Foster MRN: [id number] Date of Birth: [birth year] Referring Provider (PT): Aluisio   Encounter Date: 05/07/2020   PT End of Session - 05/07/20 1039    Visit Number 1    Date for PT Re-Evaluation 07/08/20    PT Start Time 1014    PT Stop Time 1100    PT Time Calculation (min) 46 min    Activity Tolerance Patient tolerated treatment well    Behavior During Therapy [facility] for tasks assessed/performed           Past Medical History:  Diagnosis Date  . Atypical chest pain   . Depressive disorder   . HLD (hyperlipidemia)   . Insomnia     Past Surgical History:  Procedure Laterality Date  . HERNIA REPAIR    . TONSILLECTOMY AND ADENOIDECTOMY  1963    There were no vitals filed for this visit.    Subjective Assessment - 05/07/20 1015    Subjective Patient reports that she underwent a right anterior approach THA on 02/26/20.  She was given HEP, she reports that she has been doing, she reports that she really has a lot of difficulty with getting up and down from sitting, going up and down stairs and getting in and out of the car, she reports "my daily activities are not up to par like I think that they should be"    Limitations Standing;Walking;House hold activities    Patient Stated Goals move easier, be stronger, get up and down easier    Currently in Pain? Yes    Pain Score 2     Pain Location Hip    Pain Orientation Right    Pain Descriptors / Indicators Aching    Pain Type Acute pain    Pain Onset More than a month ago    Pain Frequency Constant    Aggravating Factors  upand down from sitting, in and out of the car, stairs, pain can be up to 8/10    Pain Relieving Factors rest and activiy, pain a 1-2/10    Effect of Pain on Daily Activities difficulty up and  down from sitting, stairs and in and out of the care              [facility] PT Assessment - 05/07/20 0001      Assessment   Medical Diagnosis s/p right THA    Referring Provider (PT) Aluisio    Onset Date/Surgical Date 02/26/20    Prior Therapy no      Precautions   Precautions Anterior Hip      Balance Screen   Has the patient fallen in the past 6 months No    Has the patient had a decrease in activity level because of a fear of falling?  No    Is the patient reluctant to leave their home because of a fear of falling?  No      Home Environment   Additional Comments 3 steps, some housework, some gardening      Prior Function   Level of Independence Independent    Vocation Retired    Leisure no exercise      ROM / Strength   AROM / PROM / Strength AROM;PROM;Strength      AROM   Overall AROM Comments The AROM of the right  hip was WFL's but with "twinge of pain" and c/o tightness iwth flexion, ER/IR and abduction      Strength   Overall Strength Comments right hip flexion 3+/5, abduction 3+/5 and extension 4/5      Flexibility   Soft Tissue Assessment /Muscle Length yes    Hamstrings tight    Quadriceps tight    Piriformis mild tight      Palpation   Palpation comment the scar is tight and has some decreased mobility, asked her to start doing some massage      Ambulation/Gait   Gait Comments no device, smaller steps than normal, she had difficulty going up and down stairs, antalgic and pain rated a 6/10 with the first 3-4 steps                      Objective measurements completed on examination: See above findings.       [facility] Adult PT Treatment/Exercise - 05/07/20 0001      Exercises   Exercises Knee/Hip      Knee/Hip Exercises: Aerobic   Nustep level 4 x 5 minutes      Manual Therapy   Manual Therapy Soft tissue mobilization    Soft tissue mobilization through clothing to the scar and the right anterior lateral thigh to help with the  tightness                    PT Short Term Goals - 05/07/20 1153      PT SHORT TERM GOAL #1   Title independent with initial HEP    Time 2    Status New             PT Long Term Goals - 05/07/20 1153      PT LONG TERM GOAL #1   Title independent with advanced HEP    Time 8    Period Weeks    Status New      PT LONG TERM GOAL #2   Title increase right hip strength of flexion to 4+/5    Time 8    Period Weeks    Status New      PT LONG TERM GOAL #3   Title increase right hip strength of abduction to 4+/5    Time 8    Period Weeks    Status New      PT LONG TERM GOAL #4   Title go up and down stairs step over step without pain >2/10    Time 8    Period Weeks    Status New      PT LONG TERM GOAL #5   Title get in and out of the car without difficulty    Time 8    Period Weeks    Status New                  Plan - 05/07/20 1148    Clinical Impression Statement Patient underwent a right anterior approach THA on 02/26/20.  She was given an HEP, she reports that she has been doing this but reports that she is really struggling with her ADL's, reports pain and stiffness and difficulty with getting up from sitting, going up and down stairs, and with getting in and out of the car.  The first few steps seem to be the worst and then the pain eases some.  She has good motion but is very weak for flexion and abduction.  The  car and the mms around the hip and thigh are very tight.  She will be going on a vacation at the end of next weel and wants to try to be moving better.    Stability/Clinical Decision Making Evolving/Moderate complexity    Rehab Potential Good    PT Frequency 3x / week    PT Duration 8 weeks    PT Treatment/Interventions ADLs/Self Care Home Management;Cryotherapy;Electrical Stimulation;Moist Heat;Ultrasound;Stair training;Functional mobility training;Gait training;Therapeutic activities;Therapeutic exercise;Balance training;Neuromuscular  re-education;Manual techniques;Patient/family education    PT Next Visit Plan really work on motions, strength, functiona nd the scar area, she may bring in her husband to see how we do the scar tissue mobilization    Consulted and Agree with Plan of Care Patient           Patient will benefit from skilled therapeutic intervention in order to improve the following deficits and impairments:  Abnormal gait, Decreased range of motion, Difficulty walking, Increased muscle spasms, Decreased activity tolerance, Pain, Impaired flexibility, Decreased balance, Decreased mobility, Decreased strength  Visit Diagnosis: Pain in right hip - Plan: PT plan of care cert/re-cert  Difficulty in walking, not elsewhere classified - Plan: PT plan of care cert/re-cert     Problem List Patient Active Problem List   Diagnosis Date Noted  . Left shoulder pain 08/24/2018  . Atypical chest pain 04/28/2015    Jearld Lesch., PT 05/07/2020, 11:57 AM  [facility] [place] [address] [place], [place], [address] Phone: [phone]   Fax:  [phone]  Name: Laurie Foster MRN: [id number] Date of Birth: [birth year]

## 2020-05-09 ENCOUNTER — Ambulatory Visit: Payer: Managed Care, Other (non HMO)

## 2020-05-09 ENCOUNTER — Other Ambulatory Visit: Payer: Self-pay

## 2020-05-09 DIAGNOSIS — R262 Difficulty in walking, not elsewhere classified: Secondary | ICD-10-CM

## 2020-05-09 DIAGNOSIS — M25551 Pain in right hip: Secondary | ICD-10-CM | POA: Diagnosis not present

## 2020-05-09 NOTE — Therapy (Signed)
[facility] [place] [address] [place], [place], [address] Phone: [phone]   Fax:  [phone]  Physical Therapy Treatment  Patient Details  Name: Aaleah Hirsch MRN: [id number] Date of Birth: Sep 21, 1956 Referring Provider (PT): Aluisio   Encounter Date: 05/09/2020   PT End of Session - 05/09/20 0831    Visit Number 2    Date for PT Re-Evaluation 07/08/20    PT Start Time 0806   pt arrived late   PT Stop Time 0845    PT Time Calculation (min) 39 min    Activity Tolerance Patient tolerated treatment well    Behavior During Therapy [facility] for tasks assessed/performed           Past Medical History:  Diagnosis Date  . Atypical chest pain   . Depressive disorder   . HLD (hyperlipidemia)   . Insomnia     Past Surgical History:  Procedure Laterality Date  . HERNIA REPAIR    . TONSILLECTOMY AND ADENOIDECTOMY  1963    There were no vitals filed for this visit.   Subjective Assessment - 05/09/20 0810    Subjective Pt reports getting out of her car is the hardest. She feels a twinge when she bears weight through RLE. She did her exercises yesterday and bridging felt good, but anything opening the leg is hard.    Limitations Standing;Walking;House hold activities    Patient Stated Goals move easier, be stronger, get up and down easier    Currently in Pain? Yes    Pain Score 3     Pain Location Hip    Pain Orientation Right    Pain Descriptors / Indicators Aching    Pain Type Acute pain;Surgical pain    Pain Onset More than a month ago                             [facility] Adult PT Treatment/Exercise - 05/09/20 0001      Exercises   Exercises Knee/Hip      Pilates   Pilates Mat Supine R knee fallout/hip ER (inhale to control open not letting L hip lift off mat, exhale to bring back to center drawing in TrA) x 10      Knee/Hip Exercises: Standing   Hip Abduction Stengthening;Right;1 set;10 reps      Abduction Limitations holding onto multi-machine      Knee/Hip Exercises: Supine   Bridges with Newman Pies Squeeze Strengthening;Both;1 set;15 reps      Knee/Hip Exercises: Sidelying   Hip ABduction Strengthening;Right;1 set;10 reps    Clams 1 x 15 x 3" (pillow b/t knees for comfort)   TrA focus                   PT Short Term Goals - 05/07/20 1153      PT SHORT TERM GOAL #1   Title independent with initial HEP    Time 2    Status New             PT Long Term Goals - 05/07/20 1153      PT LONG TERM GOAL #1   Title independent with advanced HEP    Time 8    Period Weeks    Status New      PT LONG TERM GOAL #2   Title increase right hip strength of flexion to 4+/5    Time 8    Period  Weeks    Status New      PT LONG TERM GOAL #3   Title increase right hip strength of abduction to 4+/5    Time 8    Period Weeks    Status New      PT LONG TERM GOAL #4   Title go up and down stairs step over step without pain >2/10    Time 8    Period Weeks    Status New      PT LONG TERM GOAL #5   Title get in and out of the car without difficulty    Time 8    Period Weeks    Status New                 Plan - 05/09/20 8841    Clinical Impression Statement Pt presents with continued pain in anterolateral R hip and report of twinging with ambulation through stance phase. She tolerated tx well, demonstrating good form with thera ex with focus on breath connection and TrA . Pt mildly weakner in R TrA, but able to activate, reportedly making supine knee fallouts easier. Pt recommended to continue HEP and to try prone lying for passive hip extension stretching to hip flexors.    Stability/Clinical Decision Making Evolving/Moderate complexity    Rehab Potential Good    PT Frequency 3x / week    PT Duration 8 weeks    PT Treatment/Interventions ADLs/Self Care Home Management;Cryotherapy;Electrical Stimulation;Moist Heat;Ultrasound;Stair training;Functional mobility  training;Gait training;Therapeutic activities;Therapeutic exercise;Balance training;Neuromuscular re-education;Manual techniques;Patient/family education    PT Next Visit Plan MT for scar mobilization and soft tissue adhestions, static/dynamic glute strengthening, normalizing gait    Consulted and Agree with Plan of Care Patient           Patient will benefit from skilled therapeutic intervention in order to improve the following deficits and impairments:  Abnormal gait, Decreased range of motion, Difficulty walking, Increased muscle spasms, Decreased activity tolerance, Pain, Impaired flexibility, Decreased balance, Decreased mobility, Decreased strength  Visit Diagnosis: Pain in right hip  Difficulty in walking, not elsewhere classified     Problem List Patient Active Problem List   Diagnosis Date Noted  . Left shoulder pain 08/24/2018  . Atypical chest pain 04/28/2015    Marcelline Mates, PT, DPT 05/09/2020, 8:47 AM  [facility] [place] [address] [place], [place], [address] Phone: [phone]   Fax:  [phone]  Name: Lanita Stammen MRN: [id number] Date of Birth: Oct 23, 1956

## 2020-05-12 ENCOUNTER — Other Ambulatory Visit: Payer: Self-pay

## 2020-05-12 ENCOUNTER — Ambulatory Visit: Payer: Managed Care, Other (non HMO) | Admitting: Physical Therapy

## 2020-05-12 DIAGNOSIS — R262 Difficulty in walking, not elsewhere classified: Secondary | ICD-10-CM

## 2020-05-12 DIAGNOSIS — M25551 Pain in right hip: Secondary | ICD-10-CM

## 2020-05-12 NOTE — Therapy (Signed)
[facility] [place] [address] [place], [place], [address] Phone: [phone]   Fax:  [phone]  Physical Therapy Treatment  Patient Details  Name: Laurie Foster MRN: [id number] Date of Birth: 1956/03/04 Referring Provider (PT): Aluisio   Encounter Date: 05/12/2020   PT End of Session - 05/12/20 1148    Visit Number 3    Date for PT Re-Evaluation 07/08/20    PT Start Time 1100    PT Stop Time 1145    PT Time Calculation (min) 45 min           Past Medical History:  Diagnosis Date  . Atypical chest pain   . Depressive disorder   . HLD (hyperlipidemia)   . Insomnia     Past Surgical History:  Procedure Laterality Date  . HERNIA REPAIR    . TONSILLECTOMY AND ADENOIDECTOMY  1963    There were no vitals filed for this visit.   Subjective Assessment - 05/12/20 1107    Subjective Patient is encouraged because she notices she is going up and down the stairs with more ease. She has been doing her exercises and feels good, but pain is still causing her to limp.    Limitations Standing;Walking;House hold activities    Pain Score 3     Pain Location Hip    Pain Orientation Right                             [facility] Adult PT Treatment/Exercise - 05/12/20 0001      Knee/Hip Exercises: Stretches   Active Hamstring Stretch Both;30 seconds;2 reps    Hip Flexor Stretch 2 reps;Both;30 seconds      Knee/Hip Exercises: Aerobic   Recumbent Bike 6 minutes      Knee/Hip Exercises: Seated   Hamstring Curl 2 sets;Both;15 reps;Strengthening   slow on eccentric   Sit to Sand 2 sets;10 reps;without UE support      Knee/Hip Exercises: Supine   Bridges Strengthening;Right;Left;2 sets;15 reps    Straight Leg Raises Strengthening;Right;Left;2 sets;10 reps   3#     Manual Therapy   Manual Therapy Soft tissue mobilization    Soft tissue mobilization through clothing to the scar and the right anterior lateral  thigh to help with the tightness                  PT Education - 05/12/20 1148    Education Details Patient was given copies of hamstring and thomas stretch.    Person(s) Educated Patient    Methods Explanation;Tactile cues    Comprehension Verbalized understanding;Returned demonstration            PT Short Term Goals - 05/07/20 1153      PT SHORT TERM GOAL #1   Title independent with initial HEP    Time 2    Status New             PT Long Term Goals - 05/07/20 1153      PT LONG TERM GOAL #1   Title independent with advanced HEP    Time 8    Period Weeks    Status New      PT LONG TERM GOAL #2   Title increase right hip strength of flexion to 4+/5    Time 8    Period Weeks    Status New      PT LONG TERM GOAL #3  Title increase right hip strength of abduction to 4+/5    Time 8    Period Weeks    Status New      PT LONG TERM GOAL #4   Title go up and down stairs step over step without pain >2/10    Time 8    Period Weeks    Status New      PT LONG TERM GOAL #5   Title get in and out of the car without difficulty    Time 8    Period Weeks    Status New                 Plan - 05/12/20 1149    Clinical Impression Statement Patient tolerated progress of TE well with no increasing pain. Verbal and tactile cues fort technique for most TE & stretches. Patient reported benefit from massage and scar tissue mobs.    Rehab Potential Good    PT Frequency 3x / week    PT Duration 8 weeks    PT Treatment/Interventions ADLs/Self Care Home Management;Cryotherapy;Electrical Stimulation;Moist Heat;Ultrasound;Stair training;Functional mobility training;Gait training;Therapeutic activities;Therapeutic exercise;Balance training;Neuromuscular re-education;Manual techniques;Patient/family education    PT Next Visit Plan continue with scar mobs and stretching, advance TE, address goals.    Consulted and Agree with Plan of Care Patient           Patient  will benefit from skilled therapeutic intervention in order to improve the following deficits and impairments:  Abnormal gait, Decreased range of motion, Difficulty walking, Increased muscle spasms, Decreased activity tolerance, Pain, Impaired flexibility, Decreased balance, Decreased mobility, Decreased strength  Visit Diagnosis: Pain in right hip  Difficulty in walking, not elsewhere classified     Problem List Patient Active Problem List   Diagnosis Date Noted  . Left shoulder pain 08/24/2018  . Atypical chest pain 04/28/2015    Norva Pavlov, SPTA 05/12/2020, 11:56 AM  [facility] [place] [address] [place], [place], [address] Phone: [phone]   Fax:  [phone]  Name: Maleya Leever MRN: [id number] Date of Birth: Oct 09, 1956

## 2020-05-14 ENCOUNTER — Other Ambulatory Visit: Payer: Self-pay

## 2020-05-14 ENCOUNTER — Encounter: Payer: Self-pay | Admitting: Physical Therapy

## 2020-05-14 ENCOUNTER — Ambulatory Visit: Payer: Managed Care, Other (non HMO) | Admitting: Physical Therapy

## 2020-05-14 DIAGNOSIS — M25551 Pain in right hip: Secondary | ICD-10-CM

## 2020-05-14 DIAGNOSIS — R262 Difficulty in walking, not elsewhere classified: Secondary | ICD-10-CM

## 2020-05-14 NOTE — Therapy (Signed)
[facility] [place] [address] [place], [place], [address] Phone: [phone]   Fax:  [phone]  Physical Therapy Treatment  Patient Details  Name: Laurie Foster MRN: [id number] Date of Birth: Jan 25, 1956 Referring Provider (PT): Aluisio   Encounter Date: 05/14/2020   PT End of Session - 05/14/20 1401    Visit Number 4    Date for PT Re-Evaluation 07/08/20    PT Start Time 1315    PT Stop Time 1401    PT Time Calculation (min) 46 min    Activity Tolerance Patient tolerated treatment well    Behavior During Therapy [facility] for tasks assessed/performed           Past Medical History:  Diagnosis Date  . Atypical chest pain   . Depressive disorder   . HLD (hyperlipidemia)   . Insomnia     Past Surgical History:  Procedure Laterality Date  . HERNIA REPAIR    . TONSILLECTOMY AND ADENOIDECTOMY  1963    There were no vitals filed for this visit.   Subjective Assessment - 05/14/20 1313    Subjective Pt reports she is having trouble sleeping on R side. Pt would like to go through home exercises to make sure she is completing them correctly    Currently in Pain? Yes    Pain Score 5     Pain Location Hip    Pain Orientation Right                             [facility] Adult PT Treatment/Exercise - 05/14/20 0001      Knee/Hip Exercises: Aerobic   Nustep L5 x 7 min      Knee/Hip Exercises: Standing   Heel Raises Both;1 set;10 reps    Heel Raises Limitations 2#    Knee Flexion Both;1 set;10 reps    Knee Flexion Limitations 2#    Hip Flexion Both;1 set;10 reps    Hip Flexion Limitations 2#    Hip Abduction Both;1 set;10 reps    Abduction Limitations 2#    Hip Extension Both;1 set;10 reps    Extension Limitations 2#    Other Standing Knee Exercises sit to stand 1x5 with no UE support; 1x10 with LLE elevated    Other Standing Knee Exercises sidestepping 2x10 with yellow TB around ankles       Knee/Hip Exercises: Supine   Bridges Both;1 set;10 reps    Other Supine Knee/Hip Exercises knee fallouts x10 each side      Knee/Hip Exercises: Sidelying   Hip ABduction 1 set;Right;10 reps    Clams 1x10 R side      Manual Therapy   Manual Therapy Soft tissue mobilization;Passive ROM    Soft tissue mobilization through clothing to the scar and the right anterior lateral thigh to help with the tightness    Passive ROM hip flexor and quad stretching in prone                    PT Short Term Goals - 05/07/20 1153      PT SHORT TERM GOAL #1   Title independent with initial HEP    Time 2    Status New             PT Long Term Goals - 05/07/20 1153      PT LONG TERM GOAL #1   Title independent with advanced HEP  Time 8    Period Weeks    Status New      PT LONG TERM GOAL #2   Title increase right hip strength of flexion to 4+/5    Time 8    Period Weeks    Status New      PT LONG TERM GOAL #3   Title increase right hip strength of abduction to 4+/5    Time 8    Period Weeks    Status New      PT LONG TERM GOAL #4   Title go up and down stairs step over step without pain >2/10    Time 8    Period Weeks    Status New      PT LONG TERM GOAL #5   Title get in and out of the car without difficulty    Time 8    Period Weeks    Status New                 Plan - 05/14/20 1402    Clinical Impression Statement Pt tolerated progression of TE well with no increased R hip pain reported with exercise. Some discomfort noted with lying prone and stretching of hip flexors. Pt requested massage and scar tissue mobs; stated this was very helpful.    PT Treatment/Interventions ADLs/Self Care Home Management;Cryotherapy;Electrical Stimulation;Moist Heat;Ultrasound;Stair training;Functional mobility training;Gait training;Therapeutic activities;Therapeutic exercise;Balance training;Neuromuscular re-education;Manual techniques;Patient/family education    PT Next  Visit Plan continue with scar mobs and stretching, advance TE, address goals.    Consulted and Agree with Plan of Care Patient           Patient will benefit from skilled therapeutic intervention in order to improve the following deficits and impairments:  Abnormal gait, Decreased range of motion, Difficulty walking, Increased muscle spasms, Decreased activity tolerance, Pain, Impaired flexibility, Decreased balance, Decreased mobility, Decreased strength  Visit Diagnosis: Pain in right hip  Difficulty in walking, not elsewhere classified     Problem List Patient Active Problem List   Diagnosis Date Noted  . Left shoulder pain 08/24/2018  . Atypical chest pain 04/28/2015   Lysle Rubens, PT, DPT Maryanna Shape Sigismund Cross 05/14/2020, 2:04 PM  [facility] [place] [address] [place], [place], [address] Phone: [phone]   Fax:  [phone]  Name: Laurie Foster MRN: [id number] Date of Birth: [birth year]

## 2020-05-16 ENCOUNTER — Ambulatory Visit: Payer: Managed Care, Other (non HMO) | Admitting: Physical Therapy

## 2020-05-16 ENCOUNTER — Other Ambulatory Visit: Payer: Self-pay

## 2020-05-16 DIAGNOSIS — M25551 Pain in right hip: Secondary | ICD-10-CM

## 2020-05-16 DIAGNOSIS — R262 Difficulty in walking, not elsewhere classified: Secondary | ICD-10-CM

## 2020-05-16 NOTE — Therapy (Signed)
[place]aur[place] Foster, [place], [address] Phone: [phone]   Fax:  [phone]  Physical Therapy Treatment  Patient Details  Name: Laurie Foster MRN: [id number] Date of Birth: 1956-06-19 Referring Provider (PT): Aluisio   Encounter Date: 05/16/2020   PT End of Session - 05/16/20 8182    Visit Number 5    Date for PT Re-Evaluation 07/08/20    PT Start Time 0930    PT Stop Time 1013    PT Time Calculation (min) 43 min           Past Medical History:  Diagnosis Date  . Atypical chest pain   . Depressive disorder   . HLD (hyperlipidemia)   . Insomnia     Past Surgical History:  Procedure Laterality Date  . HERNIA REPAIR    . TONSILLECTOMY AND ADENOIDECTOMY  1963    There were no vitals filed for this visit.   Subjective Assessment - 05/16/20 0939    Subjective Patient okay. Still feeling pain going in and out of car and when she turns in bed. Pt. wakes up nightly from pain in hip.    Limitations Standing;Walking;House hold activities    Patient Stated Goals move easier, be stronger, get up and down easier    Pain Score 3     Pain Location Hip    Pain Orientation Right              [facility] PT Assessment - 05/16/20 0001      Strength   Overall Strength Comments right hip flexion 4/5, abduction 4+/5                         [facility] Adult PT Treatment/Exercise - 05/16/20 0001      Ambulation/Gait   Stairs Yes    Stairs Assistance 5: Supervision    Stair Management Technique No rails;Alternating pattern    Gait Comments Patient came in with 3/10 pain, and no increasing pain during ascending/descending, Pt to note more difficulty going down      Knee/Hip Exercises: Aerobic   Nustep L5 x 7 min      Knee/Hip Exercises: Standing   Other Standing Knee Exercises sidestepping 2x10 with red TB around ankles      Knee/Hip Exercises: Supine   Bridges Both;1 set;10 reps     Straight Leg Raises Strengthening;Right;2 sets;15 reps   3#   Other Supine Knee/Hip Exercises clams 2 x10 redTband    Other Supine Knee/Hip Exercises SL bridge 2 x 10      Manual Therapy   Manual Therapy Soft tissue mobilization;Passive ROM    Soft tissue mobilization through clothing to the scar and the right anterior lateral thigh to help with the tightness    Passive ROM hip flexor and quad stretching in prone                    PT Short Term Goals - 05/07/20 1153      PT SHORT TERM GOAL #1   Title independent with initial HEP    Time 2    Status New             PT Long Term Goals - 05/16/20 1019      PT LONG TERM GOAL #1   Title independent with advanced HEP    Status Achieved      PT LONG TERM GOAL #2  Title increase right hip strength of flexion to 4+/5    Baseline 4/5    Status On-going      PT LONG TERM GOAL #3   Title increase right hip strength of abduction to 4+/5    Baseline 4+/5    Status Achieved      PT LONG TERM GOAL #4   Title go up and down stairs step over step without pain >2/10    Baseline Patient came into clinic with 3/10 pain and no increasing pain while ascending/descending    Status Partially Met      PT LONG TERM GOAL #5   Title get in and out of the car without difficulty    Baseline still reports pain    Status On-going                 Plan - 05/16/20 1014    Clinical Impression Statement Patient tolerated progression of TE well with no increased R hip pain. Patient had no increasing pain with stairs, however, decreased speed and increased difficulty descending. Patient progressing in hip strength with difficulty in flexion and lateral rotation. Continued tightness in hipflexor & quad.    Rehab Potential Good    PT Frequency 3x / week    PT Duration 8 weeks    PT Treatment/Interventions ADLs/Self Care Home Management;Cryotherapy;Electrical Stimulation;Moist Heat;Ultrasound;Stair training;Functional mobility  training;Gait training;Therapeutic activities;Therapeutic exercise;Balance training;Neuromuscular re-education;Manual techniques;Patient/family education    PT Next Visit Plan continue with scar mobs and stretching, advance TE- lateral rotation work.    Consulted and Agree with Plan of Care Patient           Patient will benefit from skilled therapeutic intervention in order to improve the following deficits and impairments:  Abnormal gait, Decreased range of motion, Difficulty walking, Increased muscle spasms, Decreased activity tolerance, Pain, Impaired flexibility, Decreased balance, Decreased mobility, Decreased strength  Visit Diagnosis: Pain in right hip  Difficulty in walking, not elsewhere classified     Problem List Patient Active Problem List   Diagnosis Date Noted  . Left shoulder pain 08/24/2018  . Atypical chest pain 04/28/2015    Emily Brubaker, SPTA 05/16/2020, 10:22 AM  [facility] [address] [place], [place], [address] Phone: [phone]   Fax:  [phone]  Name: Laurie Foster MRN: [id number] Date of Birth: 02/26/1956   

## 2020-05-26 ENCOUNTER — Other Ambulatory Visit: Payer: Self-pay

## 2020-05-26 ENCOUNTER — Ambulatory Visit: Payer: Managed Care, Other (non HMO) | Admitting: Physical Therapy

## 2020-05-26 ENCOUNTER — Encounter: Payer: Self-pay | Admitting: Physical Therapy

## 2020-05-26 DIAGNOSIS — M25551 Pain in right hip: Secondary | ICD-10-CM | POA: Diagnosis not present

## 2020-05-26 DIAGNOSIS — R262 Difficulty in walking, not elsewhere classified: Secondary | ICD-10-CM

## 2020-05-26 NOTE — Patient Instructions (Signed)
Access Code: [email or web address] URL: [email or web address] Date: 05/26/2020 Prepared by: Lysle Rubens  Exercises Modified Maisie Fus Stretch - 1 x daily - 7 x weekly - 3 sets - 2 reps - 30 sec hold Hip Flexor Stretch at Edge of Bed - 1 x daily - 7 x weekly - 3 sets - 2 reps - 30 sec hold Sidelying ITB Stretch off Table - 1 x daily - 7 x weekly - 3 sets - 2 reps - 30 sec hold Seated Table Hamstring Stretch - 1 x daily - 7 x weekly - 3 sets - 2 reps - 30 sec hold

## 2020-05-26 NOTE — Therapy (Signed)
[place], [place], [address] Phone: [phone]   Fax:  [phone]  Physical Therapy Treatment  Patient Details  Name: Laurie Foster MRN: [id number] Date of Birth: January 09, 1956 Referring Provider (PT): Aluisio   Encounter Date: 05/26/2020   PT End of Session - 05/26/20 1158    Visit Number 6    Date for PT Re-Evaluation 07/08/20    PT Start Time 1100    PT Stop Time 1145    PT Time Calculation (min) 45 min    Activity Tolerance Patient tolerated treatment well    Behavior During Therapy [facility] for tasks assessed/performed           Past Medical History:  Diagnosis Date  . Atypical chest pain   . Depressive disorder   . HLD (hyperlipidemia)   . Insomnia     Past Surgical History:  Procedure Laterality Date  . HERNIA REPAIR    . TONSILLECTOMY AND ADENOIDECTOMY  1963    There were no vitals filed for this visit.   Subjective Assessment - 05/26/20 1105    Subjective Pt reports scar feels a little stiff but primary pain is in R groin and R knee.    Currently in Pain? Yes    Pain Score 4     Pain Location Groin    Pain Orientation Right                             [facility] Adult PT Treatment/Exercise - 05/26/20 0001      Ambulation/Gait   Stairs Yes    Stairs Assistance 5: Supervision    Stairs Assistance Details (indicate cue type and reason) cues for eccentric control of RLE    Stair Management Technique No rails;Alternating pattern      Knee/Hip Exercises: Stretches   Other Knee/Hip Stretches supine butterfly groin stretch x30 sec    Other Knee/Hip Stretches ITB stretch off table x30 sec, hip flexor stretch on R x2 min      Knee/Hip Exercises: Aerobic   Nustep L5 x 6 min    Other Aerobic UBE x 4 min      Knee/Hip Exercises: Machines for Strengthening   Cybex Knee Extension 10# 1x10 BLE, 5# 1x10 RLE      Knee/Hip Exercises: Standing   Lateral Step Up  Both;1 set;10 reps      Knee/Hip Exercises: Supine   Bridges with Ball Squeeze Both;3 sets;10 reps      Manual Therapy   Manual Therapy Soft tissue mobilization;Passive ROM    Soft tissue mobilization through clothing to the scar and the right anterior lateral thigh to help with the tightness    Passive ROM hip ROM all directions                    PT Short Term Goals - 05/07/20 1153      PT SHORT TERM GOAL #1   Title independent with initial HEP    Time 2    Status New             PT Long Term Goals - 05/16/20 1019      PT LONG TERM GOAL #1   Title independent with advanced HEP    Status Achieved      PT LONG TERM GOAL #2   Title increase right hip strength of flexion to 4+/5    Baseline 4/5  Status On-going      PT LONG TERM GOAL #3   Title increase right hip strength of abduction to 4+/5    Baseline 4+/5    Status Achieved      PT LONG TERM GOAL #4   Title go up and down stairs step over step without pain >2/10    Baseline Patient came into clinic with 3/10 pain and no increasing pain while ascending/descending    Status Partially Met      PT LONG TERM GOAL #5   Title get in and out of the car without difficulty    Baseline still reports pain    Status On-going                 Plan - 05/26/20 1159    Clinical Impression Statement Pt tolerated progression of TE well; demos some eccentric weakness of RLE with step downs and stairs. Pt reports pain in quad/hip flexors with some standing ex's. Pt reports relief with manual stretching/STM. Updated pt HEP to include hip flexor/adductor stretching ex's.    PT Treatment/Interventions ADLs/Self Care Home Management;Cryotherapy;Electrical Stimulation;Moist Heat;Ultrasound;Stair training;Functional mobility training;Gait training;Therapeutic activities;Therapeutic exercise;Balance training;Neuromuscular re-education;Manual techniques;Patient/family education    PT Next Visit Plan continue with scar  mobs and stretching, advance TE- lateral rotation work.    Consulted and Agree with Plan of Care Patient           Patient will benefit from skilled therapeutic intervention in order to improve the following deficits and impairments:  Abnormal gait, Decreased range of motion, Difficulty walking, Increased muscle spasms, Decreased activity tolerance, Pain, Impaired flexibility, Decreased balance, Decreased mobility, Decreased strength  Visit Diagnosis: Pain in right hip  Difficulty in walking, not elsewhere classified     Problem List Patient Active Problem List   Diagnosis Date Noted  . Left shoulder pain 08/24/2018  . Atypical chest pain 04/28/2015   Amador Cunas, PT, DPT Donald Prose Kenae Lindquist 05/26/2020, 12:01 PM  [place] [address] [place], [place], [address] Phone: [phone]   Fax:  [phone]  Name: Najai Waszak MRN: [id number] Date of Birth: Jan 15, 1956

## 2020-05-28 ENCOUNTER — Other Ambulatory Visit: Payer: Self-pay

## 2020-05-28 ENCOUNTER — Ambulatory Visit: Payer: Managed Care, Other (non HMO) | Admitting: Physical Therapy

## 2020-05-28 ENCOUNTER — Encounter: Payer: Self-pay | Admitting: Physical Therapy

## 2020-05-28 DIAGNOSIS — M25551 Pain in right hip: Secondary | ICD-10-CM | POA: Diagnosis not present

## 2020-05-28 DIAGNOSIS — R262 Difficulty in walking, not elsewhere classified: Secondary | ICD-10-CM

## 2020-05-28 NOTE — Therapy (Signed)
[place], [place], [address] Phone: [phone]   Fax:  [phone]  Physical Therapy Treatment  Patient Details  Name: Laurie Foster MRN: [id number] Date of Birth: 09-06-1956 Referring Provider (PT): Aluisio   Encounter Date: 05/28/2020   PT End of Session - 05/28/20 1358    Visit Number 7    Date for PT Re-Evaluation 07/08/20    PT Start Time 1315    PT Stop Time 1359    PT Time Calculation (min) 44 min    Activity Tolerance Patient tolerated treatment well    Behavior During Therapy [facility] for tasks assessed/performed           Past Medical History:  Diagnosis Date  . Atypical chest pain   . Depressive disorder   . HLD (hyperlipidemia)   . Insomnia     Past Surgical History:  Procedure Laterality Date  . HERNIA REPAIR    . TONSILLECTOMY AND ADENOIDECTOMY  1963    There were no vitals filed for this visit.   Subjective Assessment - 05/28/20 1313    Subjective Pt reports she feels pretty good today; no pain    Currently in Pain? No/denies    Pain Score 0-No pain    Pain Location Groin    Pain Orientation Right                             [facility] Adult PT Treatment/Exercise - 05/28/20 0001      Knee/Hip Exercises: Stretches   Other Knee/Hip Stretches standing forward fold hamstring stretch, quad stretch standing, side lunge stretch      Knee/Hip Exercises: Aerobic   Nustep L5 x 8 min      Knee/Hip Exercises: Machines for Strengthening   Cybex Knee Extension 10# 1x15 BLE, 15# 1x15 BLE; 5# 1x10 RLE    Cybex Knee Flexion 25# 1x15 BLE    Cybex Leg Press 40# 2x10 BLE; 20# 2x15 heel raise      Knee/Hip Exercises: Standing   Heel Raises Both;1 set;15 reps    Step Down Both;1 set;10 reps;Hand Hold: 1;Step Height: 6"    Step Down Limitations 4" for R side d/t eccentric weakness and pain in R hip    Other Standing Knee Exercises resisted gait 30# x4 each  direction    Other Standing Knee Exercises sidestepping 2x10 with red TB around ankles                    PT Short Term Goals - 05/07/20 1153      PT SHORT TERM GOAL #1   Title independent with initial HEP    Time 2    Status New             PT Long Term Goals - 05/16/20 1019      PT LONG TERM GOAL #1   Title independent with advanced HEP    Status Achieved      PT LONG TERM GOAL #2   Title increase right hip strength of flexion to 4+/5    Baseline 4/5    Status On-going      PT LONG TERM GOAL #3   Title increase right hip strength of abduction to 4+/5    Baseline 4+/5    Status Achieved      PT LONG TERM GOAL #4   Title go up and down stairs step over  step without pain >2/10    Baseline Patient came into clinic with 3/10 pain and no increasing pain while ascending/descending    Status Partially Met      PT LONG TERM GOAL #5   Title get in and out of the car without difficulty    Baseline still reports pain    Status On-going                 Plan - 05/28/20 1359    Clinical Impression Statement Pt received good report from MD; would like to cut back to 2x/week. Pt tolerated progression of TE well; difficulty with single leg on leg press d/t pain. Pt demos eccentric weakness on RLE. CGA for sidestepping with resisted gait d/t occasional LOB.    PT Treatment/Interventions ADLs/Self Care Home Management;Cryotherapy;Electrical Stimulation;Moist Heat;Ultrasound;Stair training;Functional mobility training;Gait training;Therapeutic activities;Therapeutic exercise;Balance training;Neuromuscular re-education;Manual techniques;Patient/family education    PT Next Visit Plan continue with scar mobs and stretching, advance TE- lateral rotation work.    Consulted and Agree with Plan of Care Patient           Patient will benefit from skilled therapeutic intervention in order to improve the following deficits and impairments:  Abnormal gait, Decreased range of  motion, Difficulty walking, Increased muscle spasms, Decreased activity tolerance, Pain, Impaired flexibility, Decreased balance, Decreased mobility, Decreased strength  Visit Diagnosis: Pain in right hip  Difficulty in walking, not elsewhere classified     Problem List Patient Active Problem List   Diagnosis Date Noted  . Left shoulder pain 08/24/2018  . Atypical chest pain 04/28/2015   Laurie Foster, PT, DPT Donald Prose Lovina Zuver 05/28/2020, 2:01 PM  [place] [address] [place], [place], [address] Phone: [phone]   Fax:  [phone]  Name: Laurie Foster MRN: [id number] Date of Birth: [birth year]

## 2020-05-30 ENCOUNTER — Ambulatory Visit: Payer: Managed Care, Other (non HMO) | Admitting: Physical Therapy

## 2020-06-02 ENCOUNTER — Ambulatory Visit: Payer: Managed Care, Other (non HMO) | Attending: Orthopedic Surgery | Admitting: Physical Therapy

## 2020-06-02 ENCOUNTER — Encounter: Payer: Self-pay | Admitting: Physical Therapy

## 2020-06-02 ENCOUNTER — Other Ambulatory Visit: Payer: Self-pay

## 2020-06-02 DIAGNOSIS — R262 Difficulty in walking, not elsewhere classified: Secondary | ICD-10-CM | POA: Insufficient documentation

## 2020-06-02 DIAGNOSIS — M25551 Pain in right hip: Secondary | ICD-10-CM | POA: Diagnosis present

## 2020-06-02 NOTE — Therapy (Signed)
[place], [place], [address] Phone: [phone]   Fax:  [phone]  Physical Therapy Treatment  Patient Details  Name: Laurie Foster MRN: [id number] Date of Birth: 15-Jul-1956 Referring Provider (PT): Aluisio   Encounter Date: 06/02/2020   PT End of Session - 06/02/20 1141    Visit Number 8    Date for PT Re-Evaluation 07/08/20    PT Start Time 1102    PT Stop Time 1142    PT Time Calculation (min) 40 min    Activity Tolerance Patient tolerated treatment well    Behavior During Therapy [facility] for tasks assessed/performed           Past Medical History:  Diagnosis Date   Atypical chest pain    Depressive disorder    HLD (hyperlipidemia)    Insomnia     Past Surgical History:  Procedure Laterality Date   HERNIA REPAIR     TONSILLECTOMY AND ADENOIDECTOMY  1963    There were no vitals filed for this visit.   Subjective Assessment - 06/02/20 1106    Subjective Pt reports she feels pretty good today; no pain    Currently in Pain? No/denies    Pain Score 0-No pain                             [facility] Adult PT Treatment/Exercise - 06/02/20 0001      Knee/Hip Exercises: Stretches   Gastroc Stretch Both;1 rep;30 seconds      Knee/Hip Exercises: Aerobic   Recumbent Bike L1 x 6 min      Knee/Hip Exercises: Machines for Strengthening   Cybex Knee Extension 15# 2x10 BLE; 5# 2x10 single legs    Cybex Knee Flexion 25# 2x10 BLE; 15# 2x10 single legs    Cybex Leg Press 50# 2x10 BLE; 20# single leg 1x10; 20# 2x15 heel raises      Knee/Hip Exercises: Standing   Heel Raises Both;1 set;15 reps    Step Down Both;1 set;10 reps;Hand Hold: 1;Step Height: 4"    Step Down Limitations some eccentric weakness and pain on R    Other Standing Knee Exercises resisted gait 30# x4 each direction                    PT Short Term Goals - 05/07/20 1153      PT SHORT  TERM GOAL #1   Title independent with initial HEP    Time 2    Status New             PT Long Term Goals - 05/16/20 1019      PT LONG TERM GOAL #1   Title independent with advanced HEP    Status Achieved      PT LONG TERM GOAL #2   Title increase right hip strength of flexion to 4+/5    Baseline 4/5    Status On-going      PT LONG TERM GOAL #3   Title increase right hip strength of abduction to 4+/5    Baseline 4+/5    Status Achieved      PT LONG TERM GOAL #4   Title go up and down stairs step over step without pain >2/10    Baseline Patient came into clinic with 3/10 pain and no increasing pain while ascending/descending    Status Partially Met      PT  LONG TERM GOAL #5   Title get in and out of the car without difficulty    Baseline still reports pain    Status On-going                 Plan - 06/02/20 1141    Clinical Impression Statement Pt tolerated progression of TE well with no reports of increased RLE pain during session. Pt demos eccentric weakness of RLE as compared to LLE. CGA for resisted gait sidestepping and cues for longer step length. Pt difficulty with single leg on leg press.    PT Treatment/Interventions ADLs/Self Care Home Management;Cryotherapy;Electrical Stimulation;Moist Heat;Ultrasound;Stair training;Functional mobility training;Gait training;Therapeutic activities;Therapeutic exercise;Balance training;Neuromuscular re-education;Manual techniques;Patient/family education    PT Next Visit Plan continue with scar mobs and stretching, advance TE- lateral rotation work.    Consulted and Agree with Plan of Care Patient           Patient will benefit from skilled therapeutic intervention in order to improve the following deficits and impairments:  Abnormal gait, Decreased range of motion, Difficulty walking, Increased muscle spasms, Decreased activity tolerance, Pain, Impaired flexibility, Decreased balance, Decreased mobility, Decreased  strength  Visit Diagnosis: Pain in right hip  Difficulty in walking, not elsewhere classified     Problem List Patient Active Problem List   Diagnosis Date Noted   Left shoulder pain 08/24/2018   Atypical chest pain 04/28/2015   Laurie Foster, PT, DPT Laurie Foster 06/02/2020, 11:42 AM  [place], [place], [address] Phone: [phone]   Fax:  [phone]  Name: Laurie Foster MRN: [id number] Date of Birth: 02/17/1956

## 2020-06-04 ENCOUNTER — Encounter: Payer: Self-pay | Admitting: Physical Therapy

## 2020-06-04 ENCOUNTER — Ambulatory Visit: Payer: Managed Care, Other (non HMO) | Admitting: Physical Therapy

## 2020-06-04 ENCOUNTER — Other Ambulatory Visit: Payer: Self-pay

## 2020-06-04 DIAGNOSIS — M25551 Pain in right hip: Secondary | ICD-10-CM | POA: Diagnosis not present

## 2020-06-04 DIAGNOSIS — R262 Difficulty in walking, not elsewhere classified: Secondary | ICD-10-CM

## 2020-06-04 NOTE — Therapy (Signed)
[place], [place], [address] Phone: [phone]   Fax:  [phone]  Physical Therapy Treatment  Patient Details  Name: Laurie Foster MRN: [id number] Date of Birth: Aug 03, 1956 Referring Provider (PT): Aluisio   Encounter Date: 06/04/2020   PT End of Session - 06/04/20 1147    Visit Number 9    Date for PT Re-Evaluation 07/08/20    PT Start Time 1106    PT Stop Time 1145    PT Time Calculation (min) 39 min    Activity Tolerance Patient tolerated treatment well           Past Medical History:  Diagnosis Date  . Atypical chest pain   . Depressive disorder   . HLD (hyperlipidemia)   . Insomnia     Past Surgical History:  Procedure Laterality Date  . HERNIA REPAIR    . TONSILLECTOMY AND ADENOIDECTOMY  1963    There were no vitals filed for this visit.   Subjective Assessment - 06/04/20 1106    Subjective Pt reports she feels good    Currently in Pain? No/denies    Pain Score 0-No pain                             [facility] Adult PT Treatment/Exercise - 06/04/20 0001      Knee/Hip Exercises: Stretches   Gastroc Stretch Both;1 rep;30 seconds    Other Knee/Hip Stretches butterfly stretch supine      Knee/Hip Exercises: Aerobic   Recumbent Bike L1 x 6 min    Nustep L5 x 6 min      Knee/Hip Exercises: Machines for Strengthening   Cybex Leg Press 50# 2x10 BLE; 20# single leg 1x10      Knee/Hip Exercises: Standing   Heel Raises Both;1 set;15 reps    Step Down Both;1 set;10 reps;Hand Hold: 1;Step Height: 4"      Knee/Hip Exercises: Supine   Bridges Both;1 set;10 reps    Bridges Limitations with PPT    Bridges with [employer] Both;1 set;10 reps      Knee/Hip Exercises: Prone   Other Prone Exercises quadruped fire hydrants and glute kickbacks x10   unable to do 2nd set d/t wrist pain in quadruped                   PT Short Term Goals - 05/07/20  1153      PT SHORT TERM GOAL #1   Title independent with initial HEP    Time 2    Status New             PT Long Term Goals - 05/16/20 1019      PT LONG TERM GOAL #1   Title independent with advanced HEP    Status Achieved      PT LONG TERM GOAL #2   Title increase right hip strength of flexion to 4+/5    Baseline 4/5    Status On-going      PT LONG TERM GOAL #3   Title increase right hip strength of abduction to 4+/5    Baseline 4+/5    Status Achieved      PT LONG TERM GOAL #4   Title go up and down stairs step over step without pain >2/10    Baseline Patient came into clinic with 3/10 pain and no increasing pain while ascending/descending  Status Partially Met      PT LONG TERM GOAL #5   Title get in and out of the car without difficulty    Baseline still reports pain    Status On-going                 Plan - 06/04/20 1148    Clinical Impression Statement Pt tolerated progression of TE well with no reports of increased RLE pain during session. Improved strength of RLE with eccentric step downs. Limited time with quadruped ex's d/t pt reports of R wrist pain in this position.    PT Treatment/Interventions ADLs/Self Care Home Management;Cryotherapy;Electrical Stimulation;Moist Heat;Ultrasound;Stair training;Functional mobility training;Gait training;Therapeutic activities;Therapeutic exercise;Balance training;Neuromuscular re-education;Manual techniques;Patient/family education    PT Next Visit Plan continue with scar mobs and stretching, advance TE- lateral rotation work.    Consulted and Agree with Plan of Care Patient           Patient will benefit from skilled therapeutic intervention in order to improve the following deficits and impairments:  Abnormal gait, Decreased range of motion, Difficulty walking, Increased muscle spasms, Decreased activity tolerance, Pain, Impaired flexibility, Decreased balance, Decreased mobility, Decreased strength  Visit  Diagnosis: Pain in right hip  Difficulty in walking, not elsewhere classified     Problem List Patient Active Problem List   Diagnosis Date Noted  . Left shoulder pain 08/24/2018  . Atypical chest pain 04/28/2015   Amador Cunas, PT, DPT Donald Prose Lafonda Patron 06/04/2020, 11:49 AM  [place] [address] [place], [place], [address] Phone: [phone]   Fax:  [phone]  Name: Eboni Coval MRN: [id number] Date of Birth: 04/06/1956

## 2020-06-06 ENCOUNTER — Ambulatory Visit: Payer: Managed Care, Other (non HMO) | Admitting: Physical Therapy

## 2020-06-10 ENCOUNTER — Encounter: Payer: Self-pay | Admitting: Physical Therapy

## 2020-06-10 ENCOUNTER — Ambulatory Visit: Payer: Managed Care, Other (non HMO) | Admitting: Physical Therapy

## 2020-06-10 ENCOUNTER — Other Ambulatory Visit: Payer: Self-pay

## 2020-06-10 DIAGNOSIS — R262 Difficulty in walking, not elsewhere classified: Secondary | ICD-10-CM

## 2020-06-10 DIAGNOSIS — M25551 Pain in right hip: Secondary | ICD-10-CM | POA: Diagnosis not present

## 2020-06-10 NOTE — Therapy (Signed)
[place], [place], [address] Phone: [phone]   Fax:  [phone]  Physical Therapy Treatment Progress Note Reporting Period 05/07/2020 to 06/10/2020  See note below for Objective Data and Assessment of Progress/Goals.      Patient Details  Name: Laurie Foster MRN: [id number] Date of Birth: April 20, 1956 Referring Provider (PT): Aluisio   Encounter Date: 06/10/2020   PT End of Session - 06/10/20 1142    Visit Number 10    Date for PT Re-Evaluation 07/08/20    PT Start Time 1102    PT Stop Time 1143    PT Time Calculation (min) 41 min    Activity Tolerance Patient tolerated treatment well    Behavior During Therapy [facility] for tasks assessed/performed           Past Medical History:  Diagnosis Date  . Atypical chest pain   . Depressive disorder   . HLD (hyperlipidemia)   . Insomnia     Past Surgical History:  Procedure Laterality Date  . HERNIA REPAIR    . TONSILLECTOMY AND ADENOIDECTOMY  1963    There were no vitals filed for this visit.   Subjective Assessment - 06/10/20 1107    Subjective Pt reports feeling good; notes that scar still feels pretty tight. Advised to continue scar massage and stretching.    Currently in Pain? No/denies    Pain Score 0-No pain                             [facility] Adult PT Treatment/Exercise - 06/10/20 0001      Ambulation/Gait   Stairs Yes    Stairs Assistance 7: Independent    Stair Management Technique No rails;Alternating pattern    Number of Stairs 48    Height of Stairs 6      Knee/Hip Exercises: Stretches   Hip Flexor Stretch 2 reps;Right;30 seconds      Knee/Hip Exercises: Aerobic   Recumbent Bike L2 x 6 min      Knee/Hip Exercises: Machines for Strengthening   Cybex Knee Extension 15# 2x10 BLE; 5# 1x10 single legs    Cybex Knee Flexion 25# 2x10 BLE; 25# 1x10 single legs    Cybex Leg Press 50# 2x10 BLE; 20#  single leg 1x10; heel raise 20# 2x15      Knee/Hip Exercises: Standing   Other Standing Knee Exercises mini squat x3 with 10 sec hold at counter      Manual Therapy   Manual Therapy Soft tissue mobilization;Passive ROM    Soft tissue mobilization through clothing to the scar and the right anterior lateral thigh to help with the tightness    Passive ROM hip ROM all directions                    PT Short Term Goals - 06/10/20 1144      PT SHORT TERM GOAL #1   Title independent with initial HEP    Status Achieved             PT Long Term Goals - 06/10/20 1144      PT LONG TERM GOAL #1   Title independent with advanced HEP    Status Achieved      PT LONG TERM GOAL #2   Title increase right hip strength of flexion to 4+/5    Status Achieved      PT  LONG TERM GOAL #3   Title increase right hip strength of abduction to 4+/5    Status Achieved      PT LONG TERM GOAL #4   Title go up and down stairs step over step without pain >2/10    Status Achieved      PT LONG TERM GOAL #5   Title get in and out of the car without difficulty    Baseline mild difficulty and reports difficulty rising from a squat    Status Partially Met                 Plan - 06/10/20 1143    Clinical Impression Statement Pt is progressing towards goals. Able to complete stairs step over step, no HR, and only mild eccentric weakness of RLE with fatigue. Pt still demos some functional weakness of RLE; has trouble standing back up from squat position when bending over to pick up things in the house. Added mini squat at counter with instructions to slowly progress depth of squat to address this issue.    PT Treatment/Interventions ADLs/Self Care Home Management;Cryotherapy;Electrical Stimulation;Moist Heat;Ultrasound;Stair training;Functional mobility training;Gait training;Therapeutic activities;Therapeutic exercise;Balance training;Neuromuscular re-education;Manual techniques;Patient/family  education    PT Next Visit Plan continue with scar mobs and stretching, advance TE- lateral rotation work.    PT Home Exercise Plan added mini squat and hip flexor stretch    Consulted and Agree with Plan of Care Patient           Patient will benefit from skilled therapeutic intervention in order to improve the following deficits and impairments:  Abnormal gait, Decreased range of motion, Difficulty walking, Increased muscle spasms, Decreased activity tolerance, Pain, Impaired flexibility, Decreased balance, Decreased mobility, Decreased strength  Visit Diagnosis: Pain in right hip  Difficulty in walking, not elsewhere classified     Problem List Patient Active Problem List   Diagnosis Date Noted  . Left shoulder pain 08/24/2018  . Atypical chest pain 04/28/2015   Amador Cunas, PT, DPT [name] 06/10/2020, 11:46 AM  [address] [place], [place], [address] Phone: [phone]   Fax:  [phone]  Name: Laurie Foster MRN: [id number] Date of Birth: [birth year]

## 2020-06-18 ENCOUNTER — Encounter: Payer: Managed Care, Other (non HMO) | Admitting: Physical Therapy

## 2020-06-20 ENCOUNTER — Encounter: Payer: Managed Care, Other (non HMO) | Admitting: Physical Therapy

## 2021-12-28 ENCOUNTER — Ambulatory Visit (INDEPENDENT_AMBULATORY_CARE_PROVIDER_SITE_OTHER): Payer: Managed Care, Other (non HMO) | Admitting: Internal Medicine

## 2021-12-28 ENCOUNTER — Other Ambulatory Visit: Payer: Self-pay

## 2021-12-28 ENCOUNTER — Encounter: Payer: Self-pay | Admitting: Internal Medicine

## 2021-12-28 VITALS — BP 116/72 | HR 75 | Temp 98.4°F | Ht 65.5 in | Wt 146.8 lb

## 2021-12-28 DIAGNOSIS — Z1211 Encounter for screening for malignant neoplasm of colon: Secondary | ICD-10-CM | POA: Diagnosis not present

## 2021-12-28 DIAGNOSIS — Z780 Asymptomatic menopausal state: Secondary | ICD-10-CM

## 2021-12-28 DIAGNOSIS — F5101 Primary insomnia: Secondary | ICD-10-CM

## 2021-12-28 DIAGNOSIS — Z0000 Encounter for general adult medical examination without abnormal findings: Secondary | ICD-10-CM

## 2021-12-28 DIAGNOSIS — Z0001 Encounter for general adult medical examination with abnormal findings: Secondary | ICD-10-CM

## 2021-12-28 NOTE — Patient Instructions (Signed)
We will get the colon cancer screening test to the house.

## 2021-12-28 NOTE — Progress Notes (Signed)
° °  Subjective:   Patient ID: Laurie Foster, female    DOB: 12-31-1955, 66 y.o.   MRN: [id number]  HPI The patient is a new 66 YO female coming in for ongoing care. Desires physical as well.  PMH, [facility], social history reviewed and updated  Review of Systems  Constitutional: Negative.   HENT: Negative.    Eyes: Negative.   Respiratory:  Negative for cough, chest tightness and shortness of breath.   Cardiovascular:  Negative for chest pain, palpitations and leg swelling.  Gastrointestinal:  Negative for abdominal distention, abdominal pain, constipation, diarrhea, nausea and vomiting.  Musculoskeletal: Negative.   Skin: Negative.   Neurological: Negative.   Psychiatric/Behavioral: Negative.     Objective:  Physical Exam Constitutional:      Appearance: She is well-developed.  HENT:     Head: Normocephalic and atraumatic.  Cardiovascular:     Rate and Rhythm: Normal rate and regular rhythm.  Pulmonary:     Effort: Pulmonary effort is normal. No respiratory distress.     Breath sounds: Normal breath sounds. No wheezing or rales.  Abdominal:     General: Bowel sounds are normal. There is no distension.     Palpations: Abdomen is soft.     Tenderness: There is no abdominal tenderness. There is no rebound.  Musculoskeletal:     Cervical back: Normal range of motion.  Skin:    General: Skin is warm and dry.  Neurological:     Mental Status: She is alert and oriented to person, place, and time.     Coordination: Coordination normal.    Vitals:   12/28/21 1111  BP: 116/72  Pulse: 75  Temp: 98.4 F (36.9 C)  TempSrc: Oral  SpO2: 98%  Weight: 146 lb 12.8 oz (66.6 kg)  Height: 5' 5.5" (1.664 m)    This visit occurred during the SARS-CoV-2 public health emergency.  Safety protocols were in place, including screening questions prior to the visit, additional usage of staff PPE, and extensive cleaning of exam room while observing appropriate contact time as indicated for  disinfecting solutions.   Assessment & Plan:

## 2022-01-01 DIAGNOSIS — Z780 Asymptomatic menopausal state: Secondary | ICD-10-CM | POA: Insufficient documentation

## 2022-01-01 DIAGNOSIS — G4700 Insomnia, unspecified: Secondary | ICD-10-CM | POA: Insufficient documentation

## 2022-01-01 DIAGNOSIS — Z0001 Encounter for general adult medical examination with abnormal findings: Secondary | ICD-10-CM | POA: Insufficient documentation

## 2022-01-01 DIAGNOSIS — Z0000 Encounter for general adult medical examination without abnormal findings: Secondary | ICD-10-CM | POA: Insufficient documentation

## 2022-01-01 NOTE — Assessment & Plan Note (Signed)
Flu shot up to date. Covid-19 up to date. Shingrix complete. Tetanus up to date. Cologuard ordered prior colonoscopy >10 years ago normal. Mammogram up to date with gyn, pap smear up to date with gyn. Counseled about sun safety and mole surveillance. Counseled about the dangers of distracted driving. Given 10 year screening recommendations.  ? ?

## 2022-01-01 NOTE — Assessment & Plan Note (Signed)
Taking low dose ocp for symptoms. She would like to continue for now. ?

## 2022-01-01 NOTE — Assessment & Plan Note (Signed)
Taking ambien 6.25 mg CR daily for insomnia and can continue. [place] narcotic database reviewed and she does not need refill today. Will let us know when she needs refill and we will prescribe.  ?

## 2022-01-18 LAB — COLOGUARD: COLOGUARD: NEGATIVE

## 2022-01-28 ENCOUNTER — Telehealth: Payer: Self-pay | Admitting: Internal Medicine

## 2022-01-28 NOTE — Telephone Encounter (Signed)
1.Medication Requested: zolpidem (AMBIEN CR) 6.25 MG CR tablet ? ?2. Pharmacy (Name, Street, [place]): [facility] [phone] - [place], [place] - [address] AT [facility] OF [address] ? ?3. On Med List: Y ? ?4. Last Visit with PCP: 12-28-2021 ? ?5. Next visit date with PCP: 12-29-2022 ? ?Pt requesting 90 day supply due to discount received w/ insurance  ?

## 2022-01-29 MED ORDER — ZOLPIDEM TARTRATE ER 6.25 MG PO TBCR: 6.2500 mg | EXTENDED_RELEASE_TABLET | Freq: Every evening | ORAL | 1 refills | Status: DC | PRN

## 2022-02-02 MED ORDER — ZOLPIDEM TARTRATE ER 6.25 MG PO TBCR: 6.2500 mg | EXTENDED_RELEASE_TABLET | Freq: Every evening | ORAL | 1 refills | Status: DC | PRN

## 2022-02-02 NOTE — Telephone Encounter (Signed)
Pt states rx was sent to wrong pharmacy  ? ?Pt requesting: zolpidem (AMBIEN CR) 6.25 MG CR tablet sent to [facility] #[id number] - [place], [place] - [address] AT [address] OF [address] ?

## 2022-02-02 NOTE — Addendum Note (Signed)
Addended by: Hillard Danker A on: 02/02/2022 03:30 PM ? ? Modules accepted: Orders ? ?

## 2022-02-02 NOTE — Telephone Encounter (Signed)
Sent to pharmacy requested please call and cancel rx to other pharmacy. ?

## 2022-03-02 ENCOUNTER — Encounter: Payer: Self-pay | Admitting: Podiatry

## 2022-03-02 ENCOUNTER — Ambulatory Visit (INDEPENDENT_AMBULATORY_CARE_PROVIDER_SITE_OTHER): Payer: Managed Care, Other (non HMO) | Admitting: Podiatry

## 2022-03-02 ENCOUNTER — Ambulatory Visit (INDEPENDENT_AMBULATORY_CARE_PROVIDER_SITE_OTHER): Payer: Managed Care, Other (non HMO)

## 2022-03-02 DIAGNOSIS — M778 Other enthesopathies, not elsewhere classified: Secondary | ICD-10-CM

## 2022-03-02 MED ORDER — MELOXICAM 15 MG PO TABS: 15.0000 mg | ORAL_TABLET | Freq: Every day | ORAL | 0 refills | Status: DC

## 2022-03-02 NOTE — Progress Notes (Signed)
?  Subjective:  ?Patient ID: Laurie Foster, female    DOB: 1956-06-04,   MRN: [id number] ? ?No chief complaint on file. ? ? ?66 y.o. female presents for concern of left second and third digits painful and swollen. This started about 3 months ago. Relates most of the pain is in second digit and feels like she has a mass. Denies any treatment.  Denies any other pedal complaints. Denies n/v/f/c.  ? ?Past Medical History:  ?Diagnosis Date  ? Atypical chest pain   ? Depressive disorder   ? HLD (hyperlipidemia)   ? Insomnia   ? ? ?Objective:  ?Physical Exam: ?Vascular: DP/PT pulses 2/4 bilateral. CFT <3 seconds. Normal hair growth on digits. No edema.  ?Skin. No lacerations or abrasions bilateral feet.  ?Musculoskeletal: MMT 5/5 bilateral lower extremities in DF, PF, Inversion and Eversion. Deceased ROM in DF of ankle joint. Tender to second MPJ dorsally and platarly. Pain with ROM of the digit particularly with PF. No pain to first or third toe.  ?Neurological: Sensation intact to light touch.  ? ?Assessment:  ? ?1. Capsulitis of foot, left   ? ? ? ?Plan:  ?Patient was evaluated and treated and all questions answered. ?Discussed capsulitis and inflammation of joint and treatment options with patient.  ?Radiographs reviewed and discussed with patient.  ?Meloxicam sent to pharmacy.  ?Discussed stiff sole shoes and recommend use of carbon fiber foot plate.  ?Discussed taping and demonstrated taping of the toe.  ?Discussed if pain does not improve may consider PT and/or MRI for further surgical planning.  ?Patient to return in 6 weeks or sooner if concerns arise.  ? ? ?Louann Sjogren, DPM  ? ? ?

## 2022-03-29 ENCOUNTER — Other Ambulatory Visit: Payer: Self-pay | Admitting: Podiatry

## 2022-03-30 NOTE — Telephone Encounter (Signed)
We can do 90 days thanks

## 2022-03-30 NOTE — Telephone Encounter (Signed)
Please advise, requesting a 90 day supply.

## 2022-04-14 ENCOUNTER — Ambulatory Visit: Payer: Managed Care, Other (non HMO) | Admitting: Podiatry

## 2022-05-31 ENCOUNTER — Telehealth: Payer: Self-pay | Admitting: Internal Medicine

## 2022-05-31 NOTE — Telephone Encounter (Signed)
She should still have refill can contact pharmacy.

## 2022-05-31 NOTE — Telephone Encounter (Signed)
Caller & Relationship to patient: Jadalee Westcott  Call back number: [phone]  Date of last office visit: 12/28/21  Date of next office visit: 12/28/21  Medication(s) to be refilled:  zolpidem (AMBIEN CR) 6.25 MG CR tablet  Preferred Pharmacy:  [id number] [place], [place] - [address] AT [facility] OF [address] & [address] Phone:  [phone]  Fax:  [phone]

## 2022-05-31 NOTE — Telephone Encounter (Signed)
Called pt. LDVM at [phone] with recommendations from Dr. Okey Dupre. Office number was provided in case she has additional questions.

## 2022-07-02 DIAGNOSIS — M25552 Pain in left hip: Secondary | ICD-10-CM | POA: Diagnosis not present

## 2022-07-20 ENCOUNTER — Ambulatory Visit: Payer: Managed Care, Other (non HMO) | Admitting: Physical Therapy

## 2022-08-06 DIAGNOSIS — M25552 Pain in left hip: Secondary | ICD-10-CM | POA: Diagnosis not present

## 2022-08-18 ENCOUNTER — Telehealth: Payer: Self-pay

## 2022-08-18 NOTE — Telephone Encounter (Signed)
Patient is going to be traveling to [place] in December and is required to get a malaria shot. Wants to know where she can get one.

## 2022-08-18 NOTE — Telephone Encounter (Signed)
Notified pt --malaria shot is not available in the office but can call local pharmacy. Pt voiced understanding.

## 2022-08-25 ENCOUNTER — Telehealth: Payer: Self-pay | Admitting: Internal Medicine

## 2022-08-25 DIAGNOSIS — M25552 Pain in left hip: Secondary | ICD-10-CM | POA: Diagnosis not present

## 2022-08-25 NOTE — Telephone Encounter (Signed)
Patient needs her zolpidem refilled - please send to [facility] on [address] in [place].  Patient would like 90 due to her insurance.  Next visit:  12/29/2022  Last visit:  12/28/2021

## 2022-08-26 ENCOUNTER — Telehealth: Payer: Self-pay | Admitting: *Deleted

## 2022-08-26 MED ORDER — ZOLPIDEM TARTRATE ER 6.25 MG PO TBCR: 6.2500 mg | EXTENDED_RELEASE_TABLET | Freq: Every evening | ORAL | 1 refills | Status: DC | PRN

## 2022-08-26 NOTE — Telephone Encounter (Signed)
Patient is going to be traveling to [place] in December and is required to get a malaria. Please advise

## 2022-08-26 NOTE — Telephone Encounter (Signed)
Please advise 

## 2022-08-26 NOTE — Telephone Encounter (Signed)
Refilled

## 2022-08-27 NOTE — Telephone Encounter (Signed)
Please schedule visit to discuss travel prevention and can prescribe meds if needed. She will need to have names of all travel locations. I would recommend visit at least 4-6 weeks prior to travel in case vaccines are needed.

## 2022-08-30 NOTE — Telephone Encounter (Signed)
Scheduled in November w/ Dr. Sharlet Salina

## 2022-09-07 ENCOUNTER — Ambulatory Visit (INDEPENDENT_AMBULATORY_CARE_PROVIDER_SITE_OTHER): Payer: Medicare Other | Admitting: Internal Medicine

## 2022-09-07 ENCOUNTER — Encounter: Payer: Self-pay | Admitting: Internal Medicine

## 2022-09-07 VITALS — BP 120/80 | HR 70 | Temp 98.5°F | Ht 65.5 in | Wt 145.1 lb

## 2022-09-07 DIAGNOSIS — Z7184 Encounter for health counseling related to travel: Secondary | ICD-10-CM | POA: Insufficient documentation

## 2022-09-07 MED ORDER — ATOVAQUONE-PROGUANIL HCL 250-100 MG PO TABS: 1.0000 | ORAL_TABLET | Freq: Every day | ORAL | 0 refills | Status: DC

## 2022-09-07 MED ORDER — VIVOTIF PO CPDR: 1.0000 | DELAYED_RELEASE_CAPSULE | ORAL | 0 refills | Status: DC

## 2022-09-07 MED ORDER — CIPROFLOXACIN HCL 500 MG PO TABS: 500.0000 mg | ORAL_TABLET | Freq: Two times a day (BID) | ORAL | 0 refills | Status: AC

## 2022-09-07 NOTE — Assessment & Plan Note (Signed)
Going to [place] including [place], [place], [place]. She is not up to date on typhoid. Rx vivotif and given instructions to take every other day and finish 1-2 weeks prior to trip for effectiveness. Rx malarone 1 pill daily starting 1 day prior to trip, continue duration of trip, continue 7 days after return rx done today. Rx cipro 5 day course to fill and take with her in case of infection. Counseled on travel precautions. She is up to date on tetanus.

## 2022-09-07 NOTE — Patient Instructions (Addendum)
We have sent in the antimalaria medicine to take malarone. Take 1 pill daily starting in [place] throughout the trip, and once you get back to [place] continue taking 7 more days then stop.  The typhoid vaccine you want to take oral 1 pill every other day for total (4 pills). Finish this about 1-2 weeks before the trip to be protected on your trip.   We have also given you the prescription for ciprofloxacin which is the antibiotic to fill and take with you. This will treat either a urinary tract infection or traveler's diarrhea so start taking this if needed.

## 2022-09-07 NOTE — Progress Notes (Signed)
   Subjective:   Patient ID: Laurie Foster, female    DOB: 04-01-1956, 66 y.o.   MRN: [id number]  Medication Refill Pertinent negatives include no abdominal pain, chest pain, coughing, nausea or vomiting.   [place], [place], [place] gone mid December to end of December. No recent typhoid vaccine.  Review of Systems  Constitutional: Negative.   HENT: Negative.    Eyes: Negative.   Respiratory:  Negative for cough, chest tightness and shortness of breath.   Cardiovascular:  Negative for chest pain, palpitations and leg swelling.  Gastrointestinal:  Negative for abdominal distention, abdominal pain, constipation, diarrhea, nausea and vomiting.  Musculoskeletal: Negative.   Skin: Negative.   Neurological: Negative.   Psychiatric/Behavioral: Negative.      Objective:  Physical Exam Constitutional:      Appearance: She is well-developed.  HENT:     Head: Normocephalic and atraumatic.  Cardiovascular:     Rate and Rhythm: Normal rate and regular rhythm.  Pulmonary:     Effort: Pulmonary effort is normal. No respiratory distress.     Breath sounds: Normal breath sounds. No wheezing or rales.  Abdominal:     General: Bowel sounds are normal. There is no distension.     Palpations: Abdomen is soft.     Tenderness: There is no abdominal tenderness. There is no rebound.  Musculoskeletal:     Cervical back: Normal range of motion.  Skin:    General: Skin is warm and dry.  Neurological:     Mental Status: She is alert and oriented to person, place, and time.     Coordination: Coordination normal.     Vitals:   09/07/22 1358  BP: 120/80  Pulse: 70  Temp: 98.5 F (36.9 C)  TempSrc: Oral  SpO2: 98%  Weight: 145 lb 2 oz (65.8 kg)  Height: 5' 5.5" (1.664 m)    Assessment & Plan:  Visit time 20 minutes in face to face communication with patient and coordination of care, additional 10 minutes spent in record review, coordination or care, ordering tests,  communicating/referring to other healthcare professionals, documenting in medical records all on the same day of the visit for total time 30 minutes spent on the visit.

## 2022-09-17 ENCOUNTER — Other Ambulatory Visit: Payer: Self-pay | Admitting: Podiatrist

## 2022-09-17 MED ORDER — MELOXICAM 15 MG PO TABS: 15.0000 mg | ORAL_TABLET | Freq: Every day | ORAL | 2 refills | Status: DC

## 2022-09-17 NOTE — Progress Notes (Signed)
[facility] refill requested 90 day supply of meloxicam.  I am filling in for Dr Ralene Cork and processed this refill requiest.

## 2022-09-28 DIAGNOSIS — S46212A Strain of muscle, fascia and tendon of other parts of biceps, left arm, initial encounter: Secondary | ICD-10-CM | POA: Diagnosis not present

## 2022-11-04 DIAGNOSIS — R0982 Postnasal drip: Secondary | ICD-10-CM | POA: Diagnosis not present

## 2022-11-04 DIAGNOSIS — J019 Acute sinusitis, unspecified: Secondary | ICD-10-CM | POA: Diagnosis not present

## 2022-11-17 ENCOUNTER — Telehealth: Payer: Self-pay | Admitting: Internal Medicine

## 2022-11-17 NOTE — Telephone Encounter (Signed)
Patient called and stated she needs a refill on her zolpidem 6.25mg , a 90 day supply.

## 2022-11-17 NOTE — Telephone Encounter (Signed)
Check [place] registry last filled 08/30/2022.Marland KitchenJohny Foster

## 2022-11-18 NOTE — Telephone Encounter (Signed)
Called pt no answer LMOM can call pharmacy for the refill that's left on the Zolpidem. MD gave rx back in Oct fpr #90 w/ 1 additional refill.Marland KitchenJohny Chess

## 2022-11-18 NOTE — Telephone Encounter (Signed)
Per my records has refill call pharmacy for fill

## 2022-11-22 DIAGNOSIS — Z01419 Encounter for gynecological examination (general) (routine) without abnormal findings: Secondary | ICD-10-CM | POA: Diagnosis not present

## 2022-11-22 DIAGNOSIS — N959 Unspecified menopausal and perimenopausal disorder: Secondary | ICD-10-CM | POA: Diagnosis not present

## 2022-11-22 DIAGNOSIS — Z1231 Encounter for screening mammogram for malignant neoplasm of breast: Secondary | ICD-10-CM | POA: Diagnosis not present

## 2022-11-22 DIAGNOSIS — Z6824 Body mass index (BMI) 24.0-24.9, adult: Secondary | ICD-10-CM | POA: Diagnosis not present

## 2022-11-22 DIAGNOSIS — Z124 Encounter for screening for malignant neoplasm of cervix: Secondary | ICD-10-CM | POA: Diagnosis not present

## 2022-11-26 DIAGNOSIS — H40013 Open angle with borderline findings, low risk, bilateral: Secondary | ICD-10-CM | POA: Diagnosis not present

## 2022-12-29 ENCOUNTER — Encounter: Payer: Self-pay | Admitting: Internal Medicine

## 2022-12-29 ENCOUNTER — Ambulatory Visit (INDEPENDENT_AMBULATORY_CARE_PROVIDER_SITE_OTHER): Payer: Medicare HMO | Admitting: Internal Medicine

## 2022-12-29 ENCOUNTER — Telehealth: Payer: Self-pay

## 2022-12-29 VITALS — BP 100/80 | HR 70 | Temp 98.1°F | Ht 65.5 in | Wt 146.0 lb

## 2022-12-29 DIAGNOSIS — F5101 Primary insomnia: Secondary | ICD-10-CM | POA: Diagnosis not present

## 2022-12-29 DIAGNOSIS — R635 Abnormal weight gain: Secondary | ICD-10-CM | POA: Diagnosis not present

## 2022-12-29 DIAGNOSIS — Z0001 Encounter for general adult medical examination with abnormal findings: Secondary | ICD-10-CM

## 2022-12-29 DIAGNOSIS — G8929 Other chronic pain: Secondary | ICD-10-CM | POA: Diagnosis not present

## 2022-12-29 DIAGNOSIS — E2839 Other primary ovarian failure: Secondary | ICD-10-CM

## 2022-12-29 DIAGNOSIS — M25512 Pain in left shoulder: Secondary | ICD-10-CM

## 2022-12-29 DIAGNOSIS — R69 Illness, unspecified: Secondary | ICD-10-CM | POA: Diagnosis not present

## 2022-12-29 LAB — CBC
HCT: 40 % (ref 36.0–46.0)
Hemoglobin: 13.4 g/dL (ref 12.0–15.0)
MCHC: 33.5 g/dL (ref 30.0–36.0)
MCV: 89.3 fl (ref 78.0–100.0)
Platelets: 282 10*3/uL (ref 150.0–400.0)
RBC: 4.48 Mil/uL (ref 3.87–5.11)
RDW: 17.1 % — ABNORMAL HIGH (ref 11.5–15.5)
WBC: 5.8 10*3/uL (ref 4.0–10.5)

## 2022-12-29 LAB — COMPREHENSIVE METABOLIC PANEL
ALT: 13 U/L (ref 0–35)
AST: 16 U/L (ref 0–37)
Albumin: 4.5 g/dL (ref 3.5–5.2)
Alkaline Phosphatase: 50 U/L (ref 39–117)
BUN: 12 mg/dL (ref 6–23)
CO2: 29 mEq/L (ref 19–32)
Calcium: 10.2 mg/dL (ref 8.4–10.5)
Chloride: 102 mEq/L (ref 96–112)
Creatinine, Ser: 0.57 mg/dL (ref 0.40–1.20)
GFR: 95.36 mL/min (ref >60.00)
Glucose, Bld: 92 mg/dL (ref 70–99)
Potassium: 4.6 mEq/L (ref 3.5–5.1)
Sodium: 139 mEq/L (ref 135–145)
Total Bilirubin: 0.4 mg/dL (ref 0.2–1.2)
Total Protein: 7.6 g/dL (ref 6.0–8.3)

## 2022-12-29 LAB — LIPID PANEL
Cholesterol: 268 mg/dL — ABNORMAL HIGH (ref 0–200)
HDL: 107.1 mg/dL (ref >39.00)
LDL Cholesterol: 145 mg/dL — ABNORMAL HIGH (ref 0–99)
NonHDL: 161.02
Total CHOL/HDL Ratio: 3
Triglycerides: 80 mg/dL (ref 0.0–149.0)
VLDL: 16 mg/dL (ref 0.0–40.0)

## 2022-12-29 MED ORDER — SEMAGLUTIDE-WEIGHT MANAGEMENT 0.5 MG/0.5ML ~~LOC~~ SOAJ: 0.5000 mg | SUBCUTANEOUS | 0 refills | Status: AC

## 2022-12-29 MED ORDER — SEMAGLUTIDE-WEIGHT MANAGEMENT 1.7 MG/0.75ML ~~LOC~~ SOAJ: 1.7000 mg | SUBCUTANEOUS | 0 refills | Status: AC

## 2022-12-29 MED ORDER — SEMAGLUTIDE-WEIGHT MANAGEMENT 2.4 MG/0.75ML ~~LOC~~ SOAJ: 2.4000 mg | SUBCUTANEOUS | 0 refills | Status: AC

## 2022-12-29 MED ORDER — SEMAGLUTIDE-WEIGHT MANAGEMENT 0.25 MG/0.5ML ~~LOC~~ SOAJ: 0.2500 mg | SUBCUTANEOUS | 0 refills | Status: AC

## 2022-12-29 MED ORDER — SEMAGLUTIDE-WEIGHT MANAGEMENT 1 MG/0.5ML ~~LOC~~ SOAJ: 1.0000 mg | SUBCUTANEOUS | 0 refills | Status: AC

## 2022-12-29 NOTE — Telephone Encounter (Signed)
Reprinted AVS with this okay to mail or pick up

## 2022-12-29 NOTE — Progress Notes (Unsigned)
   Subjective:   Patient ID: Kieth Brightly, female    DOB: 1955-12-05, 67 y.o.   MRN: [id number]  HPI Here for new to medicare and physical, with new complaints about weight and shoulder pain . Please see A/P for status and treatment of chronic medical problems.   Diet: heart healthy Physical activity: sedentary Depression/mood screen: negative Hearing: intact to whispered voice Visual acuity: grossly normal, performs annual eye exam  ADLs: capable Fall risk: none Home safety: good Cognitive evaluation: intact to orientation, naming, recall and repetition EOL planning: adv directives discussed  [place] Visit from 12/29/2022 in [place] at [facility] Visit from 12/29/2022 in [place] at [facility]  PHQ-9 Total Score 4         04/13/2016   11:08 AM 12/28/2021   11:15 AM 12/29/2022    8:13 AM  [place] in the past year? No 0 1  Was there an injury with Fall?  0 1  Fall Risk Category Calculator  0 2  Fall Risk Category (Retired)  Low   (RETIRED) Patient Fall Risk Level  Low fall risk   Fall risk Follow up   Falls evaluation completed   Vision Screening   Right eye Left eye Both eyes  Without correction     With correction 20/50 20/20 20/20 $   Functional Status Survey: Is the patient deaf or have difficulty hearing?: No Does the patient have difficulty seeing, even when wearing glasses/contacts?: No Does the patient have difficulty concentrating, remembering, or making decisions?: No Does the patient have difficulty walking or climbing stairs?: No Does the patient have difficulty dressing or bathing?: No Does the patient have difficulty doing errands alone such as visiting a doctor's office or shopping?: No  I have personally reviewed and have noted 1. The patient's medical and social history - reviewed today no changes 2. Their use of alcohol, tobacco or  illicit drugs 3. Their current medications and supplements 4. The patient's functional ability including ADL's, fall risks, home safety risks and hearing or visual impairment. 5. Diet and physical activities 6. Evidence for depression or mood disorders 7. Care team reviewed and updated 8.  The patient is not on an opioid pain medication.  Patient Care Team: Hoyt Koch, MD as PCP - General (Internal Medicine) Past Medical History:  Diagnosis Date   Atypical chest pain    Depressive disorder    HLD (hyperlipidemia)    Insomnia    Past Surgical History:  Procedure Laterality Date   HERNIA REPAIR     TONSILLECTOMY AND ADENOIDECTOMY  1963   Family History  Problem Relation Age of Onset   Cancer Father        gall bladder cancer   Sudden death Mother        in sleep   High Cholesterol Brother    Hyperlipidemia Brother    Healthy Daughter    Review of Systems  Objective:  Physical Exam  Vitals:   12/29/22 0810  BP: 100/80  Pulse: 70  Temp: 98.1 F (36.7 C)  TempSrc: Oral  SpO2: 98%  Weight: 146 lb (66.2 kg)  Height: 5' 5.5" (1.664 m)    Assessment & Plan:

## 2022-12-29 NOTE — Telephone Encounter (Signed)
LVM for patient letting her know that we can either mail/ come in office to pick up the instructions for her shoulder

## 2022-12-29 NOTE — Telephone Encounter (Signed)
Patient will come by front desk tomorrow to pick it up

## 2022-12-30 DIAGNOSIS — R635 Abnormal weight gain: Secondary | ICD-10-CM | POA: Insufficient documentation

## 2022-12-30 DIAGNOSIS — M25512 Pain in left shoulder: Secondary | ICD-10-CM | POA: Diagnosis not present

## 2022-12-30 LAB — HM PAP SMEAR: HM Pap smear: NEGATIVE

## 2022-12-30 LAB — HM MAMMOGRAPHY

## 2022-12-30 NOTE — Assessment & Plan Note (Signed)
Patient has weight gain over the last several years and wishes to try glp-1. She understands that this will not be covered and she will have to pay for this if she wants. Rx wegovy per standard titration and discussed risk/benefit.

## 2022-12-30 NOTE — Assessment & Plan Note (Signed)
Having worsening problems with insomnia despite ambien 6.25 mg CR. She is using nyquil to help but waking up in middle of night. Advised meditation and relaxation to help with this. She can add zzquil to [place] cr for 1-2 weeks and then try to resume ambien cr 6.25 mg daily alone. If no benefit we can increase to ambien cr 12.5 mg qhs.

## 2022-12-30 NOTE — Assessment & Plan Note (Signed)
After fall and x-ray with orthopedics. Assessed and normal ROM. Suspect sprain and advised NSAIDs for 1-2 weeks. Given stretching exercises to help as well.

## 2022-12-30 NOTE — Assessment & Plan Note (Signed)
Flu shot up to date. Covid-19 counseled. Pneumonia complete. Shingrix complete. Tetanus due 2028. Cologuard due 2026. Mammogram due 2024, pap smear aged out and dexa ordered today. Counseled about sun safety and mole surveillance. Counseled about the dangers of distracted driving. Given 10 year screening recommendations.

## 2023-01-05 ENCOUNTER — Encounter: Payer: Self-pay | Admitting: Internal Medicine

## 2023-01-13 DIAGNOSIS — M25512 Pain in left shoulder: Secondary | ICD-10-CM | POA: Diagnosis not present

## 2023-01-13 DIAGNOSIS — M25552 Pain in left hip: Secondary | ICD-10-CM | POA: Diagnosis not present

## 2023-01-14 DIAGNOSIS — M75110 Incomplete rotator cuff tear or rupture of unspecified shoulder, not specified as traumatic: Secondary | ICD-10-CM | POA: Insufficient documentation

## 2023-01-20 DIAGNOSIS — M75102 Unspecified rotator cuff tear or rupture of left shoulder, not specified as traumatic: Secondary | ICD-10-CM | POA: Diagnosis not present

## 2023-01-26 DIAGNOSIS — M25512 Pain in left shoulder: Secondary | ICD-10-CM | POA: Diagnosis not present

## 2023-02-01 ENCOUNTER — Ambulatory Visit: Payer: Medicare Other | Admitting: Internal Medicine

## 2023-02-09 DIAGNOSIS — M1612 Unilateral primary osteoarthritis, left hip: Secondary | ICD-10-CM | POA: Diagnosis not present

## 2023-02-12 DIAGNOSIS — M1612 Unilateral primary osteoarthritis, left hip: Secondary | ICD-10-CM | POA: Insufficient documentation

## 2023-02-16 DIAGNOSIS — M25552 Pain in left hip: Secondary | ICD-10-CM | POA: Diagnosis not present

## 2023-02-21 ENCOUNTER — Telehealth: Payer: Self-pay | Admitting: Internal Medicine

## 2023-02-21 NOTE — Telephone Encounter (Signed)
Prescription Request  02/21/2023  LOV: 12/29/2022  What is the name of the medication or equipment? Zolpidem 6.25 mg.   Have you contacted your pharmacy to request a refill? Yes   Which pharmacy would you like this sent to?  [id number] [place], [place] - [address] AT [facility] OF [address] & [facility] [address] [place] [address] Phone: [phone] Fax: [phone]    Patient notified that their request is being sent to the clinical staff for review and that they should receive a response within 2 business days.   Please advise at Mobile [phone] (mobile)

## 2023-02-22 ENCOUNTER — Other Ambulatory Visit: Payer: Self-pay

## 2023-02-22 MED ORDER — ZOLPIDEM TARTRATE ER 6.25 MG PO TBCR: 6.2500 mg | EXTENDED_RELEASE_TABLET | Freq: Every evening | ORAL | 3 refills | Status: DC | PRN

## 2023-02-22 NOTE — Telephone Encounter (Signed)
Error, Forwarded to Dr Okey Dupre

## 2023-02-22 NOTE — Telephone Encounter (Signed)
Done

## 2023-02-23 MED ORDER — ZOLPIDEM TARTRATE ER 6.25 MG PO TBCR: 6.2500 mg | EXTENDED_RELEASE_TABLET | Freq: Every evening | ORAL | 1 refills | Status: DC | PRN

## 2023-02-23 NOTE — Addendum Note (Signed)
Addended by: Hillard Danker A on: 02/23/2023 10:24 AM   Modules accepted: Orders

## 2023-03-01 DIAGNOSIS — R319 Hematuria, unspecified: Secondary | ICD-10-CM | POA: Diagnosis not present

## 2023-03-01 DIAGNOSIS — N760 Acute vaginitis: Secondary | ICD-10-CM | POA: Diagnosis not present

## 2023-03-01 DIAGNOSIS — N939 Abnormal uterine and vaginal bleeding, unspecified: Secondary | ICD-10-CM | POA: Diagnosis not present

## 2023-03-01 DIAGNOSIS — N950 Postmenopausal bleeding: Secondary | ICD-10-CM | POA: Diagnosis not present

## 2023-03-01 DIAGNOSIS — D509 Iron deficiency anemia, unspecified: Secondary | ICD-10-CM | POA: Diagnosis not present

## 2023-05-16 ENCOUNTER — Telehealth: Payer: Self-pay | Admitting: Internal Medicine

## 2023-05-16 NOTE — Telephone Encounter (Signed)
Prescription Request  05/16/2023  LOV: 12/29/2022  What is the name of the medication or equipment? zolpidem (AMBIEN CR) 6.25 MG CR tablet  Pt stated that Dr. Okey Dupre and the pt had spoken about increasing her medication. Please advise  Have you contacted your pharmacy to request a refill? No  Which pharmacy would you like this sent to?  [id number] [place], [place] - [address] AT [facility] OF [address] & [facility] [address] [place] [address] Phone: [phone] Fax: [phone]    Patient notified that their request is being sent to the clinical staff for review and that they should receive a response within 2 business days.   Please advise at Mobile [phone] (mobile)

## 2023-05-17 DIAGNOSIS — H5213 Myopia, bilateral: Secondary | ICD-10-CM | POA: Diagnosis not present

## 2023-05-17 DIAGNOSIS — H2513 Age-related nuclear cataract, bilateral: Secondary | ICD-10-CM | POA: Diagnosis not present

## 2023-05-19 ENCOUNTER — Other Ambulatory Visit: Payer: Self-pay | Admitting: Nurse Practitioner

## 2023-05-19 DIAGNOSIS — F5101 Primary insomnia: Secondary | ICD-10-CM

## 2023-05-19 MED ORDER — ZOLPIDEM TARTRATE ER 12.5 MG PO TBCR: 12.5000 mg | EXTENDED_RELEASE_TABLET | Freq: Every evening | ORAL | 0 refills | Status: DC | PRN

## 2023-05-19 NOTE — Telephone Encounter (Signed)
Patient called again concerning this RX

## 2023-05-19 NOTE — Progress Notes (Signed)
I will send in 7 tablets for partial refill of the higher dose of Ambien which is 12.5 mg/tab, she can take 1 tablet by mouth in the evening for insomnia, she should try to avoid taking this in addition to other medications for sleep including over-the-counter medications unless otherwise instructed by her PCP.  Dr. Okey Dupre should be back in the office tomorrow, I will forward this note to Dr. Okey Dupre as well so that she can determine if she would like to send in additional tablets.

## 2023-05-19 NOTE — Telephone Encounter (Signed)
Please send in refill for patient

## 2023-05-20 NOTE — Telephone Encounter (Signed)
Spoke with the patient and explained to her on how to take her medication instructed by the doctor and she stated that she understood.

## 2023-05-23 NOTE — Progress Notes (Signed)
I need additional information. At visit we had advised to try either zzquil and her ambien 6.25 mg at bedtime or 12.5 mg ambien at bedtime. She was to do this for 1-2 weeks and let us know. I see no clinical information about her response to this trial. Please obtain this information.

## 2023-05-25 DIAGNOSIS — R69 Illness, unspecified: Secondary | ICD-10-CM | POA: Diagnosis not present

## 2023-05-25 DIAGNOSIS — M1612 Unilateral primary osteoarthritis, left hip: Secondary | ICD-10-CM | POA: Diagnosis not present

## 2023-05-25 NOTE — Telephone Encounter (Signed)
Patient received a short-term refill and said she was informed that she would get another refill when Dr. Okey Dupre was back in the office. She would like to know if that will still happen. She also said her insurance prefers 90 day supplies. Best callback is [phone].

## 2023-05-26 DIAGNOSIS — R262 Difficulty in walking, not elsewhere classified: Secondary | ICD-10-CM | POA: Diagnosis not present

## 2023-05-26 DIAGNOSIS — M25651 Stiffness of right hip, not elsewhere classified: Secondary | ICD-10-CM | POA: Diagnosis not present

## 2023-05-26 DIAGNOSIS — M1612 Unilateral primary osteoarthritis, left hip: Secondary | ICD-10-CM | POA: Diagnosis not present

## 2023-05-26 DIAGNOSIS — M25552 Pain in left hip: Secondary | ICD-10-CM | POA: Diagnosis not present

## 2023-05-26 NOTE — Telephone Encounter (Signed)
Called pt and she reports the higher dosage has helped her so much. She reports she was sleeping 3 hours and now she is up to sleeping 5 hours. She runs out of the last of the higher dosage today and reports she would like to stay on this if possible and will schedule an appt if she needs to. She reports she tried adding the zzquil to her normal 6.25 mg dosage at night for 2 months and it "did nothing" for her which is why she called requesting to try the higher dose. She apologized for not following up but I informed her I would send the message to Dr. Okey Dupre with how she is doing and we will let her know but advised she does have a refill remaining of the 6.25 dosage. Pt verbalized understanding.

## 2023-05-26 NOTE — Progress Notes (Signed)
Called pt and reported her experience w this trial. Please see telephone encounter

## 2023-05-26 NOTE — Telephone Encounter (Signed)
See prior office note. She was asked to try higher dose for only 1-2 weeks then resume normal 6.25 mg dosing. She was then to follow up with [place] on how she was doing. I see no information about that. She was given refill of her normal dose in April for 6 month supply so is not really due for refill.

## 2023-05-27 NOTE — Telephone Encounter (Signed)
Ok for patient to fill remaining refill of zolpidem 6.25. Follow advice from prior visit to try 12.5 mg for 2 weeks then resume 6.25 mg nightly. Sometimes this 2 week period can help resume lower dose. If she has tried lower dose and unable to do this we can change dose long term.

## 2023-05-30 ENCOUNTER — Other Ambulatory Visit: Payer: Self-pay

## 2023-05-30 DIAGNOSIS — F5101 Primary insomnia: Secondary | ICD-10-CM

## 2023-05-31 DIAGNOSIS — R262 Difficulty in walking, not elsewhere classified: Secondary | ICD-10-CM | POA: Diagnosis not present

## 2023-05-31 DIAGNOSIS — M1612 Unilateral primary osteoarthritis, left hip: Secondary | ICD-10-CM | POA: Diagnosis not present

## 2023-05-31 DIAGNOSIS — M25552 Pain in left hip: Secondary | ICD-10-CM | POA: Diagnosis not present

## 2023-05-31 DIAGNOSIS — M25651 Stiffness of right hip, not elsewhere classified: Secondary | ICD-10-CM | POA: Diagnosis not present

## 2023-05-31 NOTE — Telephone Encounter (Signed)
Spoke back with the patient and she is going to try providers advise with medication and also stated that she will schedule another appointment to discuss further more

## 2023-06-01 ENCOUNTER — Telehealth: Payer: Self-pay

## 2023-06-01 NOTE — Telephone Encounter (Signed)
Pt needs PA for Zolpidem ER 12.5mg 

## 2023-06-02 ENCOUNTER — Other Ambulatory Visit (HOSPITAL_COMMUNITY): Payer: Self-pay

## 2023-06-02 ENCOUNTER — Telehealth: Payer: Self-pay

## 2023-06-02 DIAGNOSIS — M1612 Unilateral primary osteoarthritis, left hip: Secondary | ICD-10-CM | POA: Diagnosis not present

## 2023-06-02 DIAGNOSIS — M25651 Stiffness of right hip, not elsewhere classified: Secondary | ICD-10-CM | POA: Diagnosis not present

## 2023-06-02 DIAGNOSIS — R262 Difficulty in walking, not elsewhere classified: Secondary | ICD-10-CM | POA: Diagnosis not present

## 2023-06-02 DIAGNOSIS — M25552 Pain in left hip: Secondary | ICD-10-CM | POA: Diagnosis not present

## 2023-06-02 NOTE — Telephone Encounter (Signed)
Pharmacy Patient Advocate Encounter  Received notification from [employer] that Prior Authorization for Zolpidem Tartrate ER Tablet has been DENIED. Please advise how you'd like to proceed. Full denial letter will be uploaded to the media tab. See denial reason below.  PA #/Case ID/Reference #: [id number]

## 2023-06-02 NOTE — Telephone Encounter (Signed)
Pharmacy Patient Advocate Encounter   Received notification from Pt Calls Messages that prior authorization for Zolpidem Tartrate ER 12.5MG  er tablets is required/requested.   Insurance verification completed.   The patient is insured through [facility] .   Per test claim: PA required; PA submitted to [facility] via CoverMyMeds Key/confirmation #/EOC [id number] Status is pending

## 2023-06-03 NOTE — Telephone Encounter (Signed)
Noted../lmb 

## 2023-06-06 DIAGNOSIS — M25552 Pain in left hip: Secondary | ICD-10-CM | POA: Diagnosis not present

## 2023-06-06 DIAGNOSIS — M25651 Stiffness of right hip, not elsewhere classified: Secondary | ICD-10-CM | POA: Diagnosis not present

## 2023-06-06 DIAGNOSIS — M1612 Unilateral primary osteoarthritis, left hip: Secondary | ICD-10-CM | POA: Diagnosis not present

## 2023-06-06 DIAGNOSIS — R262 Difficulty in walking, not elsewhere classified: Secondary | ICD-10-CM | POA: Diagnosis not present

## 2023-06-14 DIAGNOSIS — R262 Difficulty in walking, not elsewhere classified: Secondary | ICD-10-CM | POA: Diagnosis not present

## 2023-06-14 DIAGNOSIS — M25552 Pain in left hip: Secondary | ICD-10-CM | POA: Diagnosis not present

## 2023-06-14 DIAGNOSIS — M1612 Unilateral primary osteoarthritis, left hip: Secondary | ICD-10-CM | POA: Diagnosis not present

## 2023-06-14 DIAGNOSIS — M25651 Stiffness of right hip, not elsewhere classified: Secondary | ICD-10-CM | POA: Diagnosis not present

## 2023-06-16 DIAGNOSIS — M25651 Stiffness of right hip, not elsewhere classified: Secondary | ICD-10-CM | POA: Diagnosis not present

## 2023-06-16 DIAGNOSIS — R262 Difficulty in walking, not elsewhere classified: Secondary | ICD-10-CM | POA: Diagnosis not present

## 2023-06-16 DIAGNOSIS — M1612 Unilateral primary osteoarthritis, left hip: Secondary | ICD-10-CM | POA: Diagnosis not present

## 2023-06-16 DIAGNOSIS — M25552 Pain in left hip: Secondary | ICD-10-CM | POA: Diagnosis not present

## 2023-06-21 DIAGNOSIS — M1612 Unilateral primary osteoarthritis, left hip: Secondary | ICD-10-CM | POA: Diagnosis not present

## 2023-06-21 DIAGNOSIS — M25651 Stiffness of right hip, not elsewhere classified: Secondary | ICD-10-CM | POA: Diagnosis not present

## 2023-06-21 DIAGNOSIS — R262 Difficulty in walking, not elsewhere classified: Secondary | ICD-10-CM | POA: Diagnosis not present

## 2023-06-21 DIAGNOSIS — M25552 Pain in left hip: Secondary | ICD-10-CM | POA: Diagnosis not present

## 2023-07-18 ENCOUNTER — Ambulatory Visit (INDEPENDENT_AMBULATORY_CARE_PROVIDER_SITE_OTHER): Payer: Medicare HMO | Admitting: Internal Medicine

## 2023-07-18 ENCOUNTER — Encounter: Payer: Self-pay | Admitting: Internal Medicine

## 2023-07-18 VITALS — BP 122/82 | HR 92 | Temp 98.2°F | Ht 65.5 in | Wt 133.0 lb

## 2023-07-18 DIAGNOSIS — F5101 Primary insomnia: Secondary | ICD-10-CM | POA: Diagnosis not present

## 2023-07-18 DIAGNOSIS — E782 Mixed hyperlipidemia: Secondary | ICD-10-CM | POA: Diagnosis not present

## 2023-07-18 LAB — LIPID PANEL
Cholesterol: 255 mg/dL — ABNORMAL HIGH (ref 0–200)
HDL: 114.7 mg/dL (ref >39.00)
LDL Cholesterol: 126 mg/dL — ABNORMAL HIGH (ref 0–99)
NonHDL: 140.57
Total CHOL/HDL Ratio: 2
Triglycerides: 72 mg/dL (ref 0.0–149.0)
VLDL: 14.4 mg/dL (ref 0.0–40.0)

## 2023-07-18 MED ORDER — ZOLPIDEM TARTRATE ER 12.5 MG PO TBCR
12.5000 mg | EXTENDED_RELEASE_TABLET | Freq: Every evening | ORAL | 1 refills | Status: DC | PRN
Start: 2023-07-18 — End: 2024-01-16

## 2023-07-18 NOTE — Progress Notes (Signed)
Subjective:   Patient ID: Laurie Foster, female    DOB: [birth year], 67 y.o.   MRN: [id number]  Insomnia Primary symptoms: sleep disturbance.     The patient is a 67 YO female coming in for worsening sleep problems.  Review of Systems  Constitutional: Negative.   HENT: Negative.    Eyes: Negative.   Respiratory:  Negative for cough, chest tightness and shortness of breath.   Cardiovascular:  Negative for chest pain, palpitations and leg swelling.  Gastrointestinal:  Negative for abdominal distention, abdominal pain, constipation, diarrhea, nausea and vomiting.  Musculoskeletal: Negative.   Skin: Negative.   Neurological: Negative.   Psychiatric/Behavioral:  Positive for sleep disturbance.     Objective:  Physical Exam Constitutional:      Appearance: She is well-developed.  HENT:     Head: Normocephalic and atraumatic.  Cardiovascular:     Rate and Rhythm: Normal rate and regular rhythm.  Pulmonary:     Effort: Pulmonary effort is normal. No respiratory distress.     Breath sounds: Normal breath sounds. No wheezing or rales.  Abdominal:     General: Bowel sounds are normal. There is no distension.     Palpations: Abdomen is soft.     Tenderness: There is no abdominal tenderness. There is no rebound.  Musculoskeletal:     Cervical back: Normal range of motion.  Skin:    General: Skin is warm and dry.  Neurological:     Mental Status: She is alert and oriented to person, place, and time.     Coordination: Coordination normal.     Vitals:   07/18/23 1009  BP: 122/82  Pulse: 92  Temp: 98.2 F (36.8 C)  TempSrc: Oral  SpO2: 97%  Weight: 133 lb (60.3 kg)  Height: 5' 5.5" (1.664 m)    Assessment & Plan:

## 2023-07-18 NOTE — Assessment & Plan Note (Signed)
We will plan to increase ambien to 12.5 mg CR daily new rx done. Talked about option for other medications but potential for withdrawal of Remus Loffler which can last several weeks with change.

## 2023-07-18 NOTE — Patient Instructions (Signed)
We will increase the ambien to 12.5 mg daily.

## 2023-08-09 DIAGNOSIS — M1612 Unilateral primary osteoarthritis, left hip: Secondary | ICD-10-CM | POA: Diagnosis not present

## 2023-08-29 DIAGNOSIS — L821 Other seborrheic keratosis: Secondary | ICD-10-CM | POA: Diagnosis not present

## 2023-09-07 ENCOUNTER — Telehealth: Payer: Self-pay | Admitting: Internal Medicine

## 2023-09-07 NOTE — Telephone Encounter (Signed)
She was already requested to schedule visit for clearance needs EKG

## 2023-09-07 NOTE — Telephone Encounter (Signed)
Pt mentions that some faxes were supposed to be sent to [place] regarding her surgical clearance for her surgery. Faxes are coming from [employer]. JOHN GRAVES  Once the paperwork is received please sign and faxed back to the sender. Pt would like copies and notified once this is completed if possible. Please advise. Thanks

## 2023-09-07 NOTE — Telephone Encounter (Signed)
We have already received the forms and placed them inside Dr Frutoso Chase Office

## 2023-09-09 NOTE — Telephone Encounter (Signed)
Patient has been scheduled for ekg

## 2023-09-21 ENCOUNTER — Encounter: Payer: Self-pay | Admitting: Internal Medicine

## 2023-09-21 ENCOUNTER — Ambulatory Visit: Payer: Medicare HMO | Admitting: Internal Medicine

## 2023-09-21 VITALS — BP 125/64 | HR 70 | Temp 98.3°F | Ht 65.5 in | Wt 131.0 lb

## 2023-09-21 DIAGNOSIS — Z01810 Encounter for preprocedural cardiovascular examination: Secondary | ICD-10-CM | POA: Insufficient documentation

## 2023-09-21 NOTE — Assessment & Plan Note (Signed)
EKG done and stable from prior. No changes. She is not having any chest pains or SOB. Has adequate mets. Does not need to undergo additional testing pre-op. Will get labs pre-op with surgeon. Papers filled out and faxed back to ortho.

## 2023-09-21 NOTE — Progress Notes (Signed)
   Subjective:   Patient ID: Laurie Foster, female    DOB: [birth year], 67 y.o.   MRN: [id number]  HPI The patient is a 67 YO female coming in for surgical clearance for hip replacement. She is having pain in the hip but no other symptoms. No new chest pains or SOB. Non-smoker and active.   PMH, [facility], social history reviewed and updated  Review of Systems  Constitutional: Negative.   HENT: Negative.    Eyes: Negative.   Respiratory:  Negative for cough, chest tightness and shortness of breath.   Cardiovascular:  Negative for chest pain, palpitations and leg swelling.  Gastrointestinal:  Negative for abdominal distention, abdominal pain, constipation, diarrhea, nausea and vomiting.  Musculoskeletal:  Positive for arthralgias.  Skin: Negative.   Neurological: Negative.   Psychiatric/Behavioral: Negative.      Objective:  Physical Exam Constitutional:      Appearance: She is well-developed.  HENT:     Head: Normocephalic and atraumatic.  Cardiovascular:     Rate and Rhythm: Normal rate and regular rhythm.  Pulmonary:     Effort: Pulmonary effort is normal. No respiratory distress.     Breath sounds: Normal breath sounds. No wheezing or rales.  Abdominal:     General: Bowel sounds are normal. There is no distension.     Palpations: Abdomen is soft.     Tenderness: There is no abdominal tenderness. There is no rebound.  Musculoskeletal:        General: Tenderness present.     Cervical back: Normal range of motion.  Skin:    General: Skin is warm and dry.  Neurological:     Mental Status: She is alert and oriented to person, place, and time.     Coordination: Coordination normal.     Vitals:   09/21/23 0928  BP: 125/64  Pulse: 70  Temp: 98.3 F (36.8 C)  TempSrc: Oral  SpO2: 99%  Weight: 131 lb (59.4 kg)  Height: 5' 5.5" (1.664 m)   EKG: Rate 67, axis normal, interval normal, incomplete RBBB, sinus, no st or t wave changes, no significant change compared to prior  2016   Assessment & Plan:  Visit time 20 minutes in face to face communication with patient and coordination of care, additional 10 minutes spent in record review, coordination or care, ordering tests, communicating/referring to other healthcare professionals, documenting in medical records all on the same day of the visit for total time 30 minutes spent on the visit.

## 2023-09-24 DIAGNOSIS — R051 Acute cough: Secondary | ICD-10-CM | POA: Diagnosis not present

## 2023-09-24 DIAGNOSIS — J3489 Other specified disorders of nose and nasal sinuses: Secondary | ICD-10-CM | POA: Diagnosis not present

## 2023-10-06 DIAGNOSIS — R69 Illness, unspecified: Secondary | ICD-10-CM | POA: Diagnosis not present

## 2023-10-07 LAB — HM DEXA SCAN: HM Dexa Scan: 2.4

## 2023-10-10 ENCOUNTER — Ambulatory Visit (INDEPENDENT_AMBULATORY_CARE_PROVIDER_SITE_OTHER)
Admission: RE | Admit: 2023-10-10 | Discharge: 2023-10-10 | Disposition: A | Discharge status: Home / Self Care | Payer: Medicare HMO | Source: Ambulatory Visit | Attending: Internal Medicine | Admitting: Internal Medicine

## 2023-10-10 DIAGNOSIS — E2839 Other primary ovarian failure: Secondary | ICD-10-CM

## 2023-10-11 ENCOUNTER — Encounter: Payer: Self-pay | Admitting: Internal Medicine

## 2023-11-22 DIAGNOSIS — M1612 Unilateral primary osteoarthritis, left hip: Secondary | ICD-10-CM | POA: Diagnosis not present

## 2023-11-25 ENCOUNTER — Telehealth: Payer: Self-pay | Admitting: Internal Medicine

## 2023-11-25 NOTE — Telephone Encounter (Signed)
Copied from CRM [id number]. Topic: General - Other >> Nov 25, 2023  1:55 PM Prudencio Pair wrote: Reason for CRM: Darel Hong, with [employer], called stating that they received a surgical clearance form for patient. They are in ned of the last office note faxed over to them. Fax #: [phone].

## 2023-11-25 NOTE — Telephone Encounter (Signed)
I have sent this over

## 2023-12-05 DIAGNOSIS — M1612 Unilateral primary osteoarthritis, left hip: Secondary | ICD-10-CM | POA: Diagnosis not present

## 2023-12-05 DIAGNOSIS — R262 Difficulty in walking, not elsewhere classified: Secondary | ICD-10-CM | POA: Diagnosis not present

## 2023-12-05 DIAGNOSIS — M25652 Stiffness of left hip, not elsewhere classified: Secondary | ICD-10-CM | POA: Diagnosis not present

## 2023-12-06 DIAGNOSIS — M1612 Unilateral primary osteoarthritis, left hip: Secondary | ICD-10-CM | POA: Diagnosis not present

## 2023-12-06 DIAGNOSIS — M25552 Pain in left hip: Secondary | ICD-10-CM | POA: Diagnosis not present

## 2023-12-19 DIAGNOSIS — Z01419 Encounter for gynecological examination (general) (routine) without abnormal findings: Secondary | ICD-10-CM | POA: Diagnosis not present

## 2023-12-19 DIAGNOSIS — Z6821 Body mass index (BMI) 21.0-21.9, adult: Secondary | ICD-10-CM | POA: Diagnosis not present

## 2023-12-19 DIAGNOSIS — N959 Unspecified menopausal and perimenopausal disorder: Secondary | ICD-10-CM | POA: Diagnosis not present

## 2023-12-19 DIAGNOSIS — N939 Abnormal uterine and vaginal bleeding, unspecified: Secondary | ICD-10-CM | POA: Diagnosis not present

## 2023-12-19 DIAGNOSIS — Z1231 Encounter for screening mammogram for malignant neoplasm of breast: Secondary | ICD-10-CM | POA: Diagnosis not present

## 2023-12-21 DIAGNOSIS — Z96642 Presence of left artificial hip joint: Secondary | ICD-10-CM | POA: Diagnosis not present

## 2023-12-21 DIAGNOSIS — M1612 Unilateral primary osteoarthritis, left hip: Secondary | ICD-10-CM | POA: Diagnosis not present

## 2023-12-26 DIAGNOSIS — Z96642 Presence of left artificial hip joint: Secondary | ICD-10-CM | POA: Diagnosis not present

## 2023-12-26 DIAGNOSIS — M25652 Stiffness of left hip, not elsewhere classified: Secondary | ICD-10-CM | POA: Diagnosis not present

## 2023-12-26 DIAGNOSIS — M6281 Muscle weakness (generalized): Secondary | ICD-10-CM | POA: Diagnosis not present

## 2023-12-30 DIAGNOSIS — M6281 Muscle weakness (generalized): Secondary | ICD-10-CM | POA: Diagnosis not present

## 2023-12-30 DIAGNOSIS — M25652 Stiffness of left hip, not elsewhere classified: Secondary | ICD-10-CM | POA: Diagnosis not present

## 2023-12-30 DIAGNOSIS — Z96642 Presence of left artificial hip joint: Secondary | ICD-10-CM | POA: Diagnosis not present

## 2024-01-03 ENCOUNTER — Encounter: Payer: Self-pay | Admitting: Internal Medicine

## 2024-01-03 DIAGNOSIS — M25652 Stiffness of left hip, not elsewhere classified: Secondary | ICD-10-CM | POA: Diagnosis not present

## 2024-01-04 DIAGNOSIS — M6281 Muscle weakness (generalized): Secondary | ICD-10-CM | POA: Diagnosis not present

## 2024-01-04 DIAGNOSIS — M25652 Stiffness of left hip, not elsewhere classified: Secondary | ICD-10-CM | POA: Diagnosis not present

## 2024-01-04 DIAGNOSIS — Z96642 Presence of left artificial hip joint: Secondary | ICD-10-CM | POA: Diagnosis not present

## 2024-01-06 DIAGNOSIS — M6281 Muscle weakness (generalized): Secondary | ICD-10-CM | POA: Diagnosis not present

## 2024-01-06 DIAGNOSIS — M25652 Stiffness of left hip, not elsewhere classified: Secondary | ICD-10-CM | POA: Diagnosis not present

## 2024-01-06 DIAGNOSIS — Z96642 Presence of left artificial hip joint: Secondary | ICD-10-CM | POA: Diagnosis not present

## 2024-01-12 DIAGNOSIS — M6281 Muscle weakness (generalized): Secondary | ICD-10-CM | POA: Diagnosis not present

## 2024-01-12 DIAGNOSIS — Z96642 Presence of left artificial hip joint: Secondary | ICD-10-CM | POA: Diagnosis not present

## 2024-01-12 DIAGNOSIS — M25652 Stiffness of left hip, not elsewhere classified: Secondary | ICD-10-CM | POA: Diagnosis not present

## 2024-01-16 ENCOUNTER — Ambulatory Visit (INDEPENDENT_AMBULATORY_CARE_PROVIDER_SITE_OTHER): Payer: Self-pay | Admitting: Internal Medicine

## 2024-01-16 ENCOUNTER — Encounter: Payer: Self-pay | Admitting: Internal Medicine

## 2024-01-16 VITALS — BP 88/60 | HR 77 | Ht 65.5 in | Wt 126.0 lb

## 2024-01-16 DIAGNOSIS — Z780 Asymptomatic menopausal state: Secondary | ICD-10-CM | POA: Diagnosis not present

## 2024-01-16 DIAGNOSIS — Z0000 Encounter for general adult medical examination without abnormal findings: Secondary | ICD-10-CM | POA: Diagnosis not present

## 2024-01-16 DIAGNOSIS — F5101 Primary insomnia: Secondary | ICD-10-CM

## 2024-01-16 MED ORDER — ZOLPIDEM TARTRATE ER 12.5 MG PO TBCR
12.5000 mg | EXTENDED_RELEASE_TABLET | Freq: Every evening | ORAL | 1 refills | Status: DC | PRN
Start: 2024-01-16 — End: 2024-07-16

## 2024-01-16 NOTE — Assessment & Plan Note (Signed)
 Still taking HRT and doing well with estrogen patch and progesterone oral.

## 2024-01-16 NOTE — Assessment & Plan Note (Signed)
 Continues using ambien 12.5 mg at bedtime with good success. She understands risk and benefit and wishes to continue. [place] PDMP reviewed and appropriate. Refilled.

## 2024-01-16 NOTE — Assessment & Plan Note (Signed)
 Flu shot up to date. Pneumonia complete. Shingrix complete. Tetanus due 2028. Cologuard due 2026. Mammogram due 2026, pap smear aged out and dexa complete. Counseled about sun safety and mole surveillance. Counseled about the dangers of distracted driving. Given 10 year screening recommendations.

## 2024-01-16 NOTE — Progress Notes (Signed)
 Subjective:   Patient ID: Laurie Foster, female    DOB: 01-24-1956, 68 y.o.   MRN: [id number]  HPI Here for medicare wellness and physical, no new complaints. Please see A/P for status and treatment of chronic medical problems.   Diet: heart healthy Physical activity: active, working with PT for hip replacement Depression/mood screen: negative Hearing: intact to whispered voice Visual acuity: grossly normal, performs annual eye exam  ADLs: capable Fall risk: none Home safety: good Cognitive evaluation: intact to orientation, naming, recall and repetition EOL planning: adv directives discussed  Flowsheet Row Office Visit from 01/16/2024 in [facility] [place]  PHQ-2 Total Score 0       Flowsheet Row Office Visit from 12/29/2022 in [facility]  PHQ-9 Total Score 4         12/28/2021   11:15 AM 12/29/2022    8:13 AM 07/18/2023   10:12 AM 09/21/2023    9:28 AM 01/16/2024    1:20 PM  Fall Risk  Falls in the past year? 0 1 0 0 0  Was there an injury with Fall? 0 1 0 0 0  Fall Risk Category Calculator 0 2 0 0 0  Fall Risk Category (Retired) Low      (RETIRED) Patient Fall Risk Level Low fall risk      Fall risk Follow up  Falls evaluation completed Falls evaluation completed Falls evaluation completed Falls evaluation completed   I have personally reviewed and have noted 1. The patient's medical and social history - reviewed today no changes 2. Their use of alcohol, tobacco or illicit drugs 3. Their current medications and supplements 4. The patient's functional ability including ADL's, fall risks, home safety risks and hearing or visual impairment. 5. Diet and physical activities 6. Evidence for depression or mood disorders 7. Care team reviewed and updated 8.  The patient is not on an opioid pain medication  Patient Care Team: Myrlene Broker, MD as PCP - General (Internal Medicine) Past Medical  History:  Diagnosis Date   Atypical chest pain    Depressive disorder    HLD (hyperlipidemia)    Insomnia    Past Surgical History:  Procedure Laterality Date   HERNIA REPAIR     TONSILLECTOMY AND ADENOIDECTOMY  1963   Family History  Problem Relation Age of Onset   Cancer Father        gall bladder cancer   Sudden death Mother        in sleep   High Cholesterol Brother    Hyperlipidemia Brother    Healthy Daughter    Review of Systems  Constitutional: Negative.   HENT: Negative.    Eyes: Negative.   Respiratory:  Negative for cough, chest tightness and shortness of breath.   Cardiovascular:  Negative for chest pain, palpitations and leg swelling.  Gastrointestinal:  Negative for abdominal distention, abdominal pain, constipation, diarrhea, nausea and vomiting.  Musculoskeletal: Negative.   Skin: Negative.   Neurological: Negative.   Psychiatric/Behavioral: Negative.      Objective:  Physical Exam Constitutional:      Appearance: She is well-developed.  HENT:     Head: Normocephalic and atraumatic.  Cardiovascular:     Rate and Rhythm: Normal rate and regular rhythm.  Pulmonary:     Effort: Pulmonary effort is normal. No respiratory distress.     Breath sounds: Normal breath sounds. No wheezing or rales.  Abdominal:     General:  Bowel sounds are normal. There is no distension.     Palpations: Abdomen is soft.     Tenderness: There is no abdominal tenderness. There is no rebound.  Musculoskeletal:     Cervical back: Normal range of motion.  Skin:    General: Skin is warm and dry.  Neurological:     Mental Status: She is alert and oriented to person, place, and time.     Coordination: Coordination normal.     Vitals:   01/16/24 1311  BP: (!) 88/60  Pulse: 77  TempSrc: Oral  SpO2: 98%  Weight: 126 lb (57.2 kg)  Height: 5' 5.5" (1.664 m)    Assessment & Plan:

## 2024-01-16 NOTE — Patient Instructions (Signed)
  Laurie Foster , Thank you for taking time to come for your Medicare Wellness Visit. I appreciate your ongoing commitment to your health goals. Please review the following plan we discussed and let me know if I can assist you in the future.   These are the goals we discussed:  Goals   None     This is a list of the screening recommended for you and due dates:  Health Maintenance  Topic Date Due   Hepatitis C Screening  Never done   Medicare Annual Wellness Visit  12/30/2023   COVID-19 Vaccine (7 - 2024-25 season) 02/19/2024   Mammogram  12/29/2024   Cologuard (Stool DNA test)  01/11/2025   DTaP/Tdap/Td vaccine (3 - Td or Tdap) 02/10/2027   Pneumonia Vaccine  Completed   Flu Shot  Completed   DEXA scan (bone density measurement)  Completed   Zoster (Shingles) Vaccine  Completed   HPV Vaccine  Aged Out

## 2024-01-31 ENCOUNTER — Ambulatory Visit (INDEPENDENT_AMBULATORY_CARE_PROVIDER_SITE_OTHER): Admitting: Internal Medicine

## 2024-01-31 ENCOUNTER — Encounter: Payer: Self-pay | Admitting: Internal Medicine

## 2024-01-31 VITALS — BP 116/80 | HR 59 | Temp 97.8°F | Ht 65.5 in | Wt 124.0 lb

## 2024-01-31 DIAGNOSIS — J189 Pneumonia, unspecified organism: Secondary | ICD-10-CM | POA: Diagnosis not present

## 2024-01-31 MED ORDER — PROMETHAZINE-DM 6.25-15 MG/5ML PO SYRP: 5.0000 mL | ORAL_SOLUTION | Freq: Four times a day (QID) | ORAL | 0 refills | Status: AC | PRN

## 2024-01-31 MED ORDER — AZITHROMYCIN 250 MG PO TABS: ORAL_TABLET | ORAL | 0 refills | Status: AC

## 2024-01-31 NOTE — Progress Notes (Signed)
   Subjective:   Patient ID: Laurie Foster, female    DOB: 1955-11-17, 68 y.o.   MRN: [id number]  HPI The patient is a 68 YO female coming in for 10 days of cough and chills and congestion. Overall worsening. No SOB but trouble breathing with sinus congestion. Coughing and worsening. Taking flonase and otc without significant relief.  Review of Systems  Constitutional:  Positive for activity change, appetite change and chills. Negative for fatigue, fever and unexpected weight change.  HENT:  Positive for congestion, postnasal drip, rhinorrhea and sinus pressure. Negative for ear discharge, ear pain, sinus pain, sneezing, sore throat, tinnitus, trouble swallowing and voice change.   Eyes: Negative.   Respiratory:  Positive for cough. Negative for chest tightness, shortness of breath and wheezing.   Cardiovascular: Negative.   Gastrointestinal: Negative.   Musculoskeletal:  Positive for myalgias.  Neurological: Negative.     Objective:  Physical Exam Constitutional:      Appearance: She is well-developed.  HENT:     Head: Normocephalic and atraumatic.     Comments: Oropharynx with redness and clear drainage, nose with swollen turbinates, TMs normal bilaterally.  Neck:     Thyroid: No thyromegaly.  Cardiovascular:     Rate and Rhythm: Normal rate and regular rhythm.  Pulmonary:     Effort: Pulmonary effort is normal. No respiratory distress.     Breath sounds: Rhonchi present. No wheezing or rales.  Abdominal:     Palpations: Abdomen is soft.  Musculoskeletal:        General: Tenderness present.     Cervical back: Normal range of motion.  Lymphadenopathy:     Cervical: No cervical adenopathy.  Skin:    General: Skin is warm and dry.  Neurological:     Mental Status: She is alert and oriented to person, place, and time.     Vitals:   01/31/24 0901  BP: 116/80  Pulse: (!) 59  Temp: 97.8 F (36.6 C)  TempSrc: Oral  SpO2: 98%  Weight: 124 lb (56.2 kg)  Height: 5' 5.5"  (1.664 m)    Assessment & Plan:

## 2024-01-31 NOTE — Assessment & Plan Note (Signed)
 Suspected given timeline of symptoms and rhonchi on exam. Rx z-pack and promethazine/dm cough syrup.

## 2024-01-31 NOTE — Patient Instructions (Signed)
 We have sent in a cough medicine to use up to 3 times a day as needed.  We have sent in azithromycin to take 2 pills today, then 1 pill daily on days 2-5 (starting tomorrow).

## 2024-03-29 DIAGNOSIS — M25552 Pain in left hip: Secondary | ICD-10-CM | POA: Diagnosis not present

## 2024-04-02 ENCOUNTER — Ambulatory Visit (INDEPENDENT_AMBULATORY_CARE_PROVIDER_SITE_OTHER): Admitting: Internal Medicine

## 2024-04-02 ENCOUNTER — Encounter: Payer: Self-pay | Admitting: Internal Medicine

## 2024-04-02 VITALS — BP 112/72 | HR 71 | Temp 98.0°F | Ht 65.5 in | Wt 125.1 lb

## 2024-04-02 DIAGNOSIS — R5383 Other fatigue: Secondary | ICD-10-CM | POA: Insufficient documentation

## 2024-04-02 DIAGNOSIS — J029 Acute pharyngitis, unspecified: Secondary | ICD-10-CM | POA: Diagnosis not present

## 2024-04-02 NOTE — Assessment & Plan Note (Signed)
 Checking for EBV given extended course sore throat with fatigue. Suspect viral etiology. No signs of infection bacterial on exam today. Can continue zyrtec prn and otc for pain.

## 2024-04-02 NOTE — Assessment & Plan Note (Signed)
 Checking for EBV given concurrence with sore throat.

## 2024-04-02 NOTE — Progress Notes (Signed)
   Subjective:   Patient ID: Laurie Foster, female    DOB: 03/02/1956, 68 y.o.   MRN: [id number]  Sore Throat  Pertinent negatives include no abdominal pain, coughing, diarrhea, shortness of breath or vomiting.   The patient is a 68 YO female coming in for sore throat for 3 weeks off and on but never leaving. Not worsening. No cough or drainage. No nose running. Some sneezing and taking zyrtec intermittently does not affect sore throat. Also fatigue.   Review of Systems  Constitutional:  Positive for fatigue.  HENT:  Positive for sore throat.   Eyes: Negative.   Respiratory:  Negative for cough, chest tightness and shortness of breath.   Cardiovascular:  Negative for chest pain, palpitations and leg swelling.  Gastrointestinal:  Negative for abdominal distention, abdominal pain, constipation, diarrhea, nausea and vomiting.  Musculoskeletal: Negative.   Skin: Negative.   Neurological: Negative.   Psychiatric/Behavioral: Negative.      Objective:  Physical Exam Constitutional:      Appearance: She is well-developed.  HENT:     Head: Normocephalic and atraumatic.     Mouth/Throat:     Comments: Mild erythema no drainage or lesions oropharynx Cardiovascular:     Rate and Rhythm: Normal rate and regular rhythm.  Pulmonary:     Effort: Pulmonary effort is normal. No respiratory distress.     Breath sounds: Normal breath sounds. No wheezing or rales.  Abdominal:     General: Bowel sounds are normal. There is no distension.     Palpations: Abdomen is soft.     Tenderness: There is no abdominal tenderness. There is no rebound.  Musculoskeletal:     Cervical back: Normal range of motion.  Skin:    General: Skin is warm and dry.  Neurological:     Mental Status: She is alert and oriented to person, place, and time.     Coordination: Coordination normal.     Vitals:   04/02/24 1425  BP: 112/72  Pulse: 71  Temp: 98 F (36.7 C)  TempSrc: Temporal  SpO2: 98%  Weight: 125 lb  2 oz (56.8 kg)  Height: 5' 5.5" (1.664 m)    Assessment & Plan:

## 2024-04-03 LAB — EPSTEIN-BARR VIRUS VCA ANTIBODY PANEL
EBV NA IgG: >600 U/mL — ABNORMAL HIGH
EBV VCA IgG: >750 U/mL — ABNORMAL HIGH
EBV VCA IgM: <36 U/mL

## 2024-04-04 ENCOUNTER — Ambulatory Visit: Payer: Self-pay | Admitting: Internal Medicine

## 2024-04-05 ENCOUNTER — Ambulatory Visit: Payer: Self-pay

## 2024-04-05 NOTE — Telephone Encounter (Signed)
 Copied from CRM [id number]. Topic: Clinical - Red Word Triage >> Apr 05, 2024 12:43 PM Juleen Oakland F wrote: Red Word that prompted transfer to Nurse Triage: Patient was seen Monday and was told to call office with an update, says her cough is worse and she's feeling very dizzy today.   FYI Only or Action Required?: Action required by provider  Patient requesting prescription medication for dizziness to help until her appointment tomorrow if possible.   Patient was last seen in primary care on 04/02/2024 by Adelia Homestead, MD. Called Nurse Triage reporting Dizziness. Symptoms began today. Interventions attempted: Nothing. Symptoms are: stable.  Triage Disposition: See Physician Within 24 Hours  Patient/caregiver understands and will follow disposition?: Yes   Reason for Disposition  [1] MODERATE dizziness (e.g., vertigo; feels very unsteady, interferes with normal activities) AND [2] has NOT been evaluated by doctor (or NP/PA) for this  Answer Assessment - Initial Assessment Questions 1. DESCRIPTION: "Describe your dizziness."     Feels like room is spinning  2. VERTIGO: "Do you feel like either you or the room is spinning or tilting?"      Yes 3. LIGHTHEADED: "Do you feel lightheaded?" (e.g., somewhat faint, woozy, weak upon standing)     No 4. SEVERITY: "How bad is it?"  "Can you walk?"   - MILD: Feels slightly dizzy and unsteady, but is walking normally.   - MODERATE: Feels unsteady when walking, but not falling; interferes with normal activities (e.g., school, work).   - SEVERE: Unable to walk without falling, or requires assistance to walk without falling.     Moderate  5. ONSET:  "When did the dizziness begin?"     This morning  6. AGGRAVATING FACTORS: "Does anything make it worse?" (e.g., standing, change in head position)     Standing up and bending over worsen symptoms  7. CAUSE: "What do you think is causing the dizziness?"     Unsure  8. RECURRENT SYMPTOM: "Have you had  dizziness before?" If Yes, ask: "When was the last time?" "What happened that time?"     No 9. OTHER SYMPTOMS: "Do you have any other symptoms?" (e.g., headache, weakness, numbness, vomiting, earache)     Sore throat  Patient requesting medication for dizziness to help until her appointment tomorrow. Please advise.  Protocols used: Dizziness - Vertigo-A-AH

## 2024-04-06 ENCOUNTER — Ambulatory Visit: Admitting: Family Medicine

## 2024-04-08 NOTE — Progress Notes (Unsigned)
   Acute Office Visit  Subjective:     Patient ID: Laurie Foster, female    DOB: 1955-12-30, 68 y.o.   MRN: [id number]  No chief complaint on file.   HPI Patient is in today for ***  ROS Per HPI      Objective:    There were no vitals taken for this visit.   Physical Exam Vitals and nursing note reviewed.  Constitutional:      General: She is not in acute distress.    Appearance: Normal appearance. She is normal weight.  HENT:     Head: Normocephalic and atraumatic.     Right Ear: External ear normal.     Left Ear: External ear normal.     Nose: Nose normal.     Mouth/Throat:     Mouth: Mucous membranes are moist.     Pharynx: Oropharynx is clear.  Eyes:     Extraocular Movements: Extraocular movements intact.     Pupils: Pupils are equal, round, and reactive to light.  Cardiovascular:     Rate and Rhythm: Normal rate and regular rhythm.     Pulses: Normal pulses.     Heart sounds: Normal heart sounds.  Pulmonary:     Effort: Pulmonary effort is normal. No respiratory distress.     Breath sounds: Normal breath sounds. No wheezing, rhonchi or rales.  Musculoskeletal:        General: Normal range of motion.     Cervical back: Normal range of motion.     Right lower leg: No edema.     Left lower leg: No edema.  Lymphadenopathy:     Cervical: No cervical adenopathy.  Neurological:     General: No focal deficit present.     Mental Status: She is alert and oriented to person, place, and time.  Psychiatric:        Mood and Affect: Mood normal.        Thought Content: Thought content normal.   No results found for any visits on 04/09/24.      Assessment & Plan:   There are no diagnoses linked to this encounter.   No orders of the defined types were placed in this encounter.   No follow-ups on file.  Wellington Half, FNP

## 2024-04-09 ENCOUNTER — Ambulatory Visit (INDEPENDENT_AMBULATORY_CARE_PROVIDER_SITE_OTHER): Admitting: Family Medicine

## 2024-04-09 ENCOUNTER — Encounter: Payer: Self-pay | Admitting: Family Medicine

## 2024-04-09 VITALS — BP 100/60 | HR 77 | Temp 98.1°F | Ht 65.5 in | Wt 123.4 lb

## 2024-04-09 DIAGNOSIS — H8110 Benign paroxysmal vertigo, unspecified ear: Secondary | ICD-10-CM | POA: Diagnosis not present

## 2024-04-09 MED ORDER — MECLIZINE HCL 12.5 MG PO TABS: 12.5000 mg | ORAL_TABLET | Freq: Three times a day (TID) | ORAL | 0 refills | Status: AC | PRN

## 2024-04-09 NOTE — Assessment & Plan Note (Signed)
 If she fails meclizine, will send to PT

## 2024-04-09 NOTE — Progress Notes (Signed)
   Acute Office Visit  Subjective:     Patient ID: Laurie Foster, female    DOB: 02/15/1956, 68 y.o.   MRN: [id number]  Chief Complaint  Patient presents with   Dizziness    Pt started experiencing dizziness Thursday, symptoms has gotten better after taking a friend medication( meclizine )    Patient presents with complaints of "room spinning" that began last Thursday. States that symptoms are intermittent and are worse when moving head from side-to-side. She reports a recent sore throat and swollen lymph nodes, but those have improved. She denies chest pain, shortness of breath, weakness, or unsteady gait. Patient reports that she took a meclizine when dizziness started and felt she experienced improvement of her symptoms. Her dizziness has improved since onset; however, she continues to experience some dizziness when moving her head.   Dizziness   Review of Systems  Neurological:  Positive for dizziness.      Objective:    BP 100/60   Pulse 77   Temp 98.1 F (36.7 C) (Temporal)   Ht 5' 5.5" (1.664 m)   Wt 123 lb 6 oz (56 kg)   SpO2 99%   BMI 20.22 kg/m    Physical Exam Vitals and nursing note reviewed.  Constitutional:      General: She is not in acute distress.    Appearance: Normal appearance. She is not ill-appearing.  HENT:     Head: Normocephalic and atraumatic.     Right Ear: A middle ear effusion is present.     Left Ear: A middle ear effusion is present.  Cardiovascular:     Rate and Rhythm: Normal rate and regular rhythm.     Pulses: Normal pulses.     Heart sounds: Normal heart sounds.  Pulmonary:     Effort: Pulmonary effort is normal.     Breath sounds: Normal breath sounds.  Skin:    General: Skin is warm and dry.     Capillary Refill: Capillary refill takes less than 2 seconds.  Neurological:     Mental Status: She is alert and oriented to person, place, and time.  Psychiatric:        Mood and Affect: Mood normal.        Behavior: Behavior  normal.    No results found for any visits on 04/09/24.      Assessment & Plan:   Problem List Items Addressed This Visit     Vertigo, benign paroxysmal, unspecified laterality - Primary   If she fails meclizine, will send to PT      Relevant Medications   meclizine (ANTIVERT) 12.5 MG tablet    Meds ordered this encounter  Medications   meclizine (ANTIVERT) 12.5 MG tablet    Sig: Take 1 tablet (12.5 mg total) by mouth 3 (three) times daily as needed for dizziness.    Dispense:  30 tablet    Refill:  0    Return if symptoms worsen or fail to improve.  Wilford Hanks, RN [place], FNP

## 2024-04-13 ENCOUNTER — Telehealth: Payer: Self-pay | Admitting: Family Medicine

## 2024-04-13 DIAGNOSIS — H8110 Benign paroxysmal vertigo, unspecified ear: Secondary | ICD-10-CM

## 2024-04-13 NOTE — Telephone Encounter (Signed)
 Still having dizziness, will refer to PT as discussed in OV

## 2024-05-13 NOTE — Progress Notes (Unsigned)
   Acute Office Visit  Subjective:     Patient ID: Laurie Foster, female    DOB: May 08, 1956, 68 y.o.   MRN: [id number]  No chief complaint on file.   HPI  Discussed the use of AI scribe software for clinical note transcription with the patient, who gave verbal consent to proceed.  History of Present Illness      ROS Per HPI      Objective:    There were no vitals taken for this visit.   Physical Exam Vitals and nursing note reviewed.  Constitutional:      General: She is not in acute distress.    Appearance: Normal appearance. She is normal weight.  HENT:     Head: Normocephalic and atraumatic.     Right Ear: External ear normal.     Left Ear: External ear normal.     Nose: Nose normal.     Mouth/Throat:     Mouth: Mucous membranes are moist.     Pharynx: Oropharynx is clear.  Eyes:     Extraocular Movements: Extraocular movements intact.     Pupils: Pupils are equal, round, and reactive to light.  Cardiovascular:     Rate and Rhythm: Normal rate and regular rhythm.     Pulses: Normal pulses.     Heart sounds: Normal heart sounds.  Pulmonary:     Effort: Pulmonary effort is normal. No respiratory distress.     Breath sounds: Normal breath sounds. No wheezing, rhonchi or rales.  Musculoskeletal:        General: Normal range of motion.     Cervical back: Normal range of motion.     Right lower leg: No edema.     Left lower leg: No edema.  Lymphadenopathy:     Cervical: No cervical adenopathy.  Neurological:     General: No focal deficit present.     Mental Status: She is alert and oriented to person, place, and time.  Psychiatric:        Mood and Affect: Mood normal.        Thought Content: Thought content normal.    No results found for any visits on 05/14/24.      Assessment & Plan:   Assessment and Plan Assessment & Plan       No orders of the defined types were placed in this encounter.   No follow-ups on file.  Corean LITTIE Ku, FNP

## 2024-05-14 ENCOUNTER — Ambulatory Visit (INDEPENDENT_AMBULATORY_CARE_PROVIDER_SITE_OTHER): Admitting: Family Medicine

## 2024-05-14 ENCOUNTER — Encounter: Payer: Self-pay | Admitting: Family Medicine

## 2024-05-14 VITALS — BP 108/80 | HR 70 | Temp 98.0°F | Ht 65.5 in | Wt 123.0 lb

## 2024-05-14 DIAGNOSIS — R0789 Other chest pain: Secondary | ICD-10-CM | POA: Diagnosis not present

## 2024-05-14 NOTE — Progress Notes (Signed)
   Established Patient Office Visit  Subjective   Patient ID: Laurie Foster, female    DOB: 29-Jan-1956  Age: 68 y.o. MRN: [id number]  Chief Complaint  Patient presents with   Acute Visit    Rib cage pains, present since July 5th. Underneath left breast, feels tight, especially when taking a deep breath    HPI  Patient presents with pain under left breast that began on July 5th. She denies known injury. States the pain is worse with deep inspiration and movement. Pain cannot be reproduced with palpation. She denies chest pain, shortness of breath, or lower extremity swelling. She states she feels that the pain is improving without intervention.     ROS See HPI   Objective:     BP 108/80 (BP Location: Left Arm, Patient Position: Sitting)   Pulse 70   Temp 98 F (36.7 C) (Temporal)   Ht 5' 5.5 (1.664 m)   Wt 123 lb (55.8 kg)   SpO2 94%   BMI 20.16 kg/m    Physical Exam Vitals and nursing note reviewed.  Constitutional:      General: She is not in acute distress.    Appearance: Normal appearance. She is normal weight.  HENT:     Head: Normocephalic and atraumatic.  Cardiovascular:     Rate and Rhythm: Normal rate and regular rhythm.     Pulses: Normal pulses.     Heart sounds: Normal heart sounds.  Pulmonary:     Effort: Pulmonary effort is normal.     Breath sounds: Normal breath sounds.  Musculoskeletal:     Cervical back: Normal range of motion and neck supple.  Skin:    General: Skin is warm and dry.     Capillary Refill: Capillary refill takes less than 2 seconds.  Neurological:     Mental Status: She is alert and oriented to person, place, and time.  Psychiatric:        Mood and Affect: Mood normal.        Behavior: Behavior normal.    No results found for any visits on 05/14/24.    The ASCVD Risk score (Arnett DK, et al., 2019) failed to calculate for the following reasons:   The valid HDL cholesterol range is 20 to 100 mg/dL    Assessment &  Plan:   Problem List Items Addressed This Visit   None Visit Diagnoses       Chest pain, musculoskeletal    -  Primary     - Gave patient stretches/exercises to help with pain. OTC NSAIDs for discomfort if needed.   Return if symptoms worsen or fail to improve.    Debby CHRISTELLA Borer, RN

## 2024-05-14 NOTE — Patient Instructions (Signed)
 I have attached exercises for you to try for your shoulder to help increase blood flow and range of motion to help the nerve  Follow-up with me for new or worsening symptoms.

## 2024-05-22 ENCOUNTER — Telehealth: Payer: Self-pay

## 2024-05-22 DIAGNOSIS — H2513 Age-related nuclear cataract, bilateral: Secondary | ICD-10-CM | POA: Diagnosis not present

## 2024-05-22 DIAGNOSIS — H5213 Myopia, bilateral: Secondary | ICD-10-CM | POA: Diagnosis not present

## 2024-05-22 NOTE — Telephone Encounter (Signed)
 Copied from CRM [id number]. Topic: Clinical - Medication Question >> May 22, 2024  9:25 AM Revonda D wrote:  Reason for CRM: Pt stated that she will be leaving out of town on 7/30 for a cruise. Pt stated that Dr.Crawford usually prescribes an antibiotic and anti diarrhea medication. Pt would like to know if Dr.Crawford could send some medication to the pharmacy before she leaves and would like to receive a callback with an update.

## 2024-05-23 NOTE — Telephone Encounter (Signed)
 Copied from CRM [id number]. Topic: Clinical - Medication Question >> May 22, 2024  9:25 AM Revonda D wrote: Reason for CRM: Pt stated that she will be leaving out of town on 7/30 for a cruise. Pt stated that Dr.Crawford usually prescribes an antibiotic and anti diarrhea medication. Pt would like to know if Dr.Crawford could send some medication to the pharmacy before she leaves and would like to receive a callback with an update. >> May 23, 2024  2:01 PM Rosina D wrote: Patient called stating she is checking on a previous message that she sent on yesterday about prescribing medication for a cruise CB [phone]

## 2024-05-23 NOTE — Telephone Encounter (Signed)
**Note De-identified  [place] Obfuscation** Please advise 

## 2024-05-24 ENCOUNTER — Other Ambulatory Visit: Payer: Self-pay | Admitting: Family

## 2024-05-24 MED ORDER — CIPROFLOXACIN HCL 250 MG PO TABS: 250.0000 mg | ORAL_TABLET | Freq: Two times a day (BID) | ORAL | 0 refills | Status: AC

## 2024-05-24 NOTE — Telephone Encounter (Signed)
 I have informed patient

## 2024-07-16 ENCOUNTER — Other Ambulatory Visit: Payer: Self-pay | Admitting: Internal Medicine

## 2024-07-16 DIAGNOSIS — F5101 Primary insomnia: Secondary | ICD-10-CM

## 2024-07-16 NOTE — Telephone Encounter (Unsigned)
 Copied from CRM #[id number]. Topic: Clinical - Medication Refill >> Jul 16, 2024 12:50 PM [place] E wrote: Medication: zolpidem  (AMBIEN  CR) 12.5 MG CR tablet  Has the patient contacted their pharmacy? Yes (Agent: If no, request that the patient contact the pharmacy for the refill. If patient does not wish to contact the pharmacy document the reason why and proceed with request.) (Agent: If yes, when and what did the pharmacy advise?)  This is the patient's preferred pharmacy:  [id number] [place], [place] - [address] [place] [address] Phone: [phone] Fax: [phone]  Is this the correct pharmacy for this prescription? Yes If no, delete pharmacy and type the correct one.   Has the prescription been filled recently? No  Is the patient out of the medication? Yes  Has the patient been seen for an appointment in the last year OR does the patient have an upcoming appointment? Yes  Can we respond through MyChart? No  Agent: Please be advised that Rx refills may take up to 3 business days. We ask that you follow-up with your pharmacy.

## 2024-07-17 MED ORDER — ZOLPIDEM TARTRATE ER 12.5 MG PO TBCR: 12.5000 mg | EXTENDED_RELEASE_TABLET | Freq: Every evening | ORAL | 1 refills | Status: AC | PRN

## 2024-09-03 DIAGNOSIS — H43813 Vitreous degeneration, bilateral: Secondary | ICD-10-CM | POA: Diagnosis not present

## 2024-09-03 DIAGNOSIS — H04123 Dry eye syndrome of bilateral lacrimal glands: Secondary | ICD-10-CM | POA: Diagnosis not present

## 2025-01-16 ENCOUNTER — Encounter: Admitting: Internal Medicine
